# Patient Record
Sex: Male | Born: 1995
Health system: Southern US, Community
[De-identification: ages and names within clinical notes are randomized; demographics above are authoritative.]

## PROBLEM LIST (undated history)

## (undated) DIAGNOSIS — R41840 Attention and concentration deficit: Secondary | ICD-10-CM

## (undated) DIAGNOSIS — T7840XA Allergy, unspecified, initial encounter: Secondary | ICD-10-CM

## (undated) HISTORY — PX: NO PAST SURGERIES: SHX2092

## (undated) HISTORY — DX: Allergy, unspecified, initial encounter: T78.40XA

---
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
## 2015-01-13 ENCOUNTER — Telehealth: Payer: Self-pay | Admitting: Family Medicine

## 2015-01-13 NOTE — Telephone Encounter (Signed)
Approval rec'd  [id number]  Approved thru 01/13/16.  Faxed to pharmacy

## 2015-01-13 NOTE — Telephone Encounter (Addendum)
PA requested from pharmacy for D-Amphetamine 20 mg.  Submitted thru [facility] Case [id number].

## 2015-01-16 ENCOUNTER — Telehealth: Payer: Self-pay | Admitting: Family Medicine

## 2015-01-16 NOTE — Telephone Encounter (Signed)
Patients mom calling to say that the wrong adderall rx was given please call her at [phone] [place]

## 2015-01-17 MED ORDER — EPINEPHRINE 0.3 MG/0.3ML IJ SOAJ: 0.3000 mg | Freq: Once | INTRAMUSCULAR | Status: DC

## 2015-01-17 NOTE — Telephone Encounter (Signed)
LMTRC

## 2015-01-17 NOTE — Telephone Encounter (Signed)
Spoke to Mom and she states that he had been taking Adderall XR and wanted to know if we could change the prescription. Pt is going to try taking the Adderall later in the day to see if he can take it without the XR but if no help will call back for the XR. Per WTP ok to change and bring old RX's back if needing the XR.

## 2015-01-23 ENCOUNTER — Ambulatory Visit: Payer: Self-pay | Admitting: Family Medicine

## 2015-05-11 ENCOUNTER — Encounter: Payer: Self-pay | Admitting: Family Medicine

## 2015-05-11 ENCOUNTER — Ambulatory Visit (INDEPENDENT_AMBULATORY_CARE_PROVIDER_SITE_OTHER): Payer: 59 | Admitting: Family Medicine

## 2015-05-11 VITALS — BP 106/70 | HR 72 | Temp 97.8°F | Ht 69.0 in | Wt 148.5 lb

## 2015-05-11 DIAGNOSIS — Z23 Encounter for immunization: Secondary | ICD-10-CM | POA: Diagnosis not present

## 2015-05-11 DIAGNOSIS — F909 Attention-deficit hyperactivity disorder, unspecified type: Secondary | ICD-10-CM | POA: Diagnosis not present

## 2015-05-11 DIAGNOSIS — F988 Other specified behavioral and emotional disorders with onset usually occurring in childhood and adolescence: Secondary | ICD-10-CM

## 2015-05-11 NOTE — Progress Notes (Signed)
BP 106/70 mmHg  Pulse 72  Temp(Src) 97.8 F (36.6 C) (Oral)  Ht  (1.753 m)  Wt 148 lb 8 oz (67.359 kg)  BMI 21.92 kg/m2   CC: new pt to establish care  Subjective:    Patient ID: Michael Mccann, male    DOB: 11/16/1995, 19 y.o.   MRN: [id number]  HPI: Michael Mccann is a 19 y.o. male presenting on 05/11/2015 for Establish Care   Prior saw Dr Michael Mccann. H/o ADD on stimulant since middle school. 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 2 yr program. Father has his own company but he will likely work for someone else.   Endorses immunizations up to date - will review.  ADD - states no longer needs stimulants.   Unsure allergy - hives seen at [facility] for this. No other more severe anaphylactic reaction like dyspnea or throat swelling. Has been prescribed epi pen but never had to use.  No h/o seasonal allergic rhinitis.  Preventative: Unsure last CPE.  Flu shot today. Not sexually active. Has GF.   Lives with parents and 3 siblings, 2 dogs and 3 cats Edu: [employer] studying HVAC Activity: hunting, baseball, golf, 4 wheeler Diet: good water, gatorade, fruits/vegetables daily  Relevant past medical, surgical, family and social history reviewed and updated as indicated. Interim medical history since our last visit reviewed. Allergies and medications reviewed and updated. Current Outpatient Prescriptions on File Prior to Visit  Medication Sig  . EPINEPHrine (EPIPEN 2-PAK) 0.3 mg/0.3 mL IJ SOAJ injection Inject 0.3 mLs (0.3 mg total) into the muscle once. Pt unsure of allergic reaction (Patient not taking: Reported on 05/11/2015)   No current facility-administered medications on file prior to visit.    Review of Systems Per HPI unless specifically indicated in ROS section     Objective:    BP 106/70 mmHg  Pulse 72  Temp(Src) 97.8 F (36.6 C) (Oral)  Ht  (1.753 m)  Wt 148 lb 8 oz (67.359 kg)  BMI 21.92 kg/m2  Wt Readings from  Last 3 Encounters:  05/11/15 148 lb 8 oz (67.359 kg) (39 %*, Z = -0.27)  12/16/14 147 lb (66.679 kg) (39 %*, Z = -0.28)   * Growth percentiles are based on CDC 2-20 Years data.    Physical Exam  Constitutional: He is oriented to person, place, and time. He appears well-developed and well-nourished. No distress.  HENT:  Head: Normocephalic and atraumatic.  Right Ear: Hearing, tympanic membrane, external ear and ear canal normal.  Left Ear: Hearing, tympanic membrane, external ear and ear canal normal.  Nose: Nose normal.  Mouth/Throat: Uvula is midline, oropharynx is clear and moist and mucous membranes are normal. No oropharyngeal exudate, posterior oropharyngeal edema or posterior oropharyngeal erythema.  Eyes: Conjunctivae and EOM are normal. Pupils are equal, round, and reactive to light. No scleral icterus.  Neck: Normal range of motion. Neck supple. No thyromegaly present.  Cardiovascular: Normal rate, regular rhythm, normal heart sounds and intact distal pulses.   No murmur heard. Pulses:      Radial pulses are 2+ on the right side, and 2+ on the left side.  Pulmonary/Chest: Effort normal and breath sounds normal. No respiratory distress. He has no wheezes. He has no rales.  Abdominal: Soft. Bowel sounds are normal. He exhibits no distension and no mass. There is no tenderness. There is no rebound and no guarding.  Musculoskeletal: Normal range of motion. He exhibits no edema.  Lymphadenopathy:    He has no cervical adenopathy.  Neurological: He is alert and oriented to person, place, and time.  CN grossly intact, station and gait intact  Skin: Skin is warm and dry. No rash noted.  Psychiatric: He has a normal mood and affect. His behavior is normal. Judgment and thought content normal.  Nursing note and vitals reviewed.  No results found for this or any previous visit.    Assessment & Plan:  Over 20 minutes were spent face-to-face with the patient during this encounter and  >50% of that time was spent on counseling and coordination of care  Problem List Items Addressed This Visit    ADD (attention deficit disorder) - Primary (Chronic)    Doing well at community college off stimulant.       Other Visit Diagnoses    Need for influenza vaccination        Relevant Orders    Flu Vaccine QUAD 36+ mos PF IM (Fluarix & Fluzone Quad PF) (Completed)        Follow up plan: Return in about 1 year (around 05/10/2016) for annual exam, prior fasting for blood work.

## 2015-05-11 NOTE — Patient Instructions (Addendum)
You are doing well today. Good to see you. Return as needed or in 1 year for physical.  Health Maintenance, Male A healthy lifestyle and preventative care can promote health and wellness.  Maintain regular health, dental, and eye exams.  Eat a healthy diet. Foods like vegetables, fruits, whole grains, low-fat dairy products, and lean protein foods contain the nutrients you need and are low in calories. Decrease your intake of foods high in solid fats, added sugars, and salt. Get information about a proper diet from your health care provider, if necessary.  Regular physical exercise is one of the most important things you can do for your health. Most adults should get at least 150 minutes of moderate-intensity exercise (any activity that increases your heart rate and causes you to sweat) each week. In addition, most adults need muscle-strengthening exercises on 2 or more days a week.   Maintain a healthy weight. The body mass index (BMI) is a screening tool to identify possible weight problems. It provides an estimate of body fat based on height and weight. Your health care provider can find your BMI and can help you achieve or maintain a healthy weight. For males 20 years and older:  A BMI below 18.5 is considered underweight.  A BMI of 18.5 to 24.9 is normal.  A BMI of 25 to 29.9 is considered overweight.  A BMI of 30 and above is considered obese.  Maintain normal blood lipids and cholesterol by exercising and minimizing your intake of saturated fat. Eat a balanced diet with plenty of fruits and vegetables. Blood tests for lipids and cholesterol should begin at age 19 and be repeated every 5 years. If your lipid or cholesterol levels are high, you are over age 150, or you are at high risk for heart disease, you may need your cholesterol levels checked more frequently.Ongoing high lipid and cholesterol levels should be treated with medicines if diet and exercise are not working.  If you  smoke, find out from your health care provider how to quit. If you do not use tobacco, do not start.  Lung cancer screening is recommended for adults aged 55-80 years who are at high risk for developing lung cancer because of a history of smoking. A yearly low-dose CT scan of the lungs is recommended for people who have at least a 30-pack-year history of smoking and are current smokers or have quit within the past 15 years. A pack year of smoking is smoking an average of 1 pack of cigarettes a day for 1 year (for example, a 30-pack-year history of smoking could mean smoking 1 pack a day for 30 years or 2 packs a day for 15 years). Yearly screening should continue until the smoker has stopped smoking for at least 15 years. Yearly screening should be stopped for people who develop a health problem that would prevent them from having lung cancer treatment.  If you choose to drink alcohol, do not have more than 2 drinks per day. One drink is considered to be 12 oz (360 mL) of beer, 5 oz (150 mL) of wine, or 1.5 oz (45 mL) of liquor.  Avoid the use of street drugs. Do not share needles with anyone. Ask for help if you need support or instructions about stopping the use of drugs.  High blood pressure causes heart disease and increases the risk of stroke. High blood pressure is more likely to develop in:  People who have blood pressure in the end of the  normal range (100-139/85-89 mm Hg).  People who are overweight or obese.  People who are African American.  If you are 77-41 years of age, have your blood pressure checked every 3-5 years. If you are 34 years of age or older, have your blood pressure checked every year. You should have your blood pressure measured twice--once when you are at a hospital or clinic, and once when you are not at a hospital or clinic. Record the average of the two measurements. To check your blood pressure when you are not at a hospital or clinic, you can use:  An automated  blood pressure machine at a pharmacy.  A home blood pressure monitor.  If you are 34-58 years old, ask your health care provider if you should take aspirin to prevent heart disease.  Diabetes screening involves taking a blood sample to check your fasting blood sugar level. This should be done once every 3 years after age 36 if you are at a normal weight and without risk factors for diabetes. Testing should be considered at a younger age or be carried out more frequently if you are overweight and have at least 1 risk factor for diabetes.  Colorectal cancer can be detected and often prevented. Most routine colorectal cancer screening begins at the age of 48 and continues through age 26. However, your health care provider may recommend screening at an earlier age if you have risk factors for colon cancer. On a yearly basis, your health care provider may provide home test kits to check for hidden blood in the stool. A small camera at the end of a tube may be used to directly examine the colon (sigmoidoscopy or colonoscopy) to detect the earliest forms of colorectal cancer. Talk to your health care provider about this at age 32 when routine screening begins. A direct exam of the colon should be repeated every 5-10 years through age 31, unless early forms of precancerous polyps or small growths are found.  People who are at an increased risk for hepatitis B should be screened for this virus. You are considered at high risk for hepatitis B if:  You were born in a country where hepatitis B occurs often. Talk with your health care provider about which countries are considered high risk.  Your parents were born in a high-risk country and you have not received a shot to protect against hepatitis B (hepatitis B vaccine).  You have HIV or AIDS.  You use needles to inject street drugs.  You live with, or have sex with, someone who has hepatitis B.  You are a man who has sex with other men (MSM).  You get  hemodialysis treatment.  You take certain medicines for conditions like cancer, organ transplantation, and autoimmune conditions.  Hepatitis C blood testing is recommended for all people born from [birth year] through 1965 and any individual with known risk factors for hepatitis C.  Healthy men should no longer receive prostate-specific antigen (PSA) blood tests as part of routine cancer screening. Talk to your health care provider about prostate cancer screening.  Testicular cancer screening is not recommended for adolescents or adult males who have no symptoms. Screening includes self-exam, a health care provider exam, and other screening tests. Consult with your health care provider about any symptoms you have or any concerns you have about testicular cancer.  Practice safe sex. Use condoms and avoid high-risk sexual practices to reduce the spread of sexually transmitted infections (STIs).  You should be screened for  STIs, including gonorrhea and chlamydia if:  You are sexually active and are younger than 24 years.  You are older than 24 years, and your health care provider tells you that you are at risk for this type of infection.  Your sexual activity has changed since you were last screened, and you are at an increased risk for chlamydia or gonorrhea. Ask your health care provider if you are at risk.  If you are at risk of being infected with HIV, it is recommended that you take a prescription medicine daily to prevent HIV infection. This is called pre-exposure prophylaxis (PrEP). You are considered at risk if:  You are a man who has sex with other men (MSM).  You are a heterosexual man who is sexually active with multiple partners.  You take drugs by injection.  You are sexually active with a partner who has HIV.  Talk with your health care provider about whether you are at high risk of being infected with HIV. If you choose to begin PrEP, you should first be tested for HIV. You should  then be tested every 3 months for as long as you are taking PrEP.  Use sunscreen. Apply sunscreen liberally and repeatedly throughout the day. You should seek shade when your shadow is shorter than you. Protect yourself by wearing long sleeves, pants, a wide-brimmed hat, and sunglasses year round whenever you are outdoors.  Tell your health care provider of new moles or changes in moles, especially if there is a change in shape or color. Also, tell your health care provider if a mole is larger than the size of a pencil eraser.  A one-time screening for abdominal aortic aneurysm (AAA) and surgical repair of large AAAs by ultrasound is recommended for men aged 45-75 years who are current or former smokers.  Stay current with your vaccines (immunizations).   This information is not intended to replace advice given to you by your health care provider. Make sure you discuss any questions you have with your health care provider.   Document Released: 11/09/2007 Document Revised: 06/03/2014 Document Reviewed: 10/08/2010 Elsevier Interactive Patient Education [employer].

## 2015-05-11 NOTE — Assessment & Plan Note (Signed)
Doing well at [employer] off stimulant.

## 2015-05-11 NOTE — Progress Notes (Signed)
Pre visit review using our clinic review tool, if applicable. No additional management support is needed unless otherwise documented below in the visit note. 

## 2016-05-13 ENCOUNTER — Encounter: Payer: 59 | Admitting: Family Medicine

## 2016-05-13 DIAGNOSIS — Z0289 Encounter for other administrative examinations: Secondary | ICD-10-CM

## 2016-05-14 ENCOUNTER — Telehealth: Payer: Self-pay | Admitting: Family Medicine

## 2016-05-14 NOTE — Telephone Encounter (Signed)
No f/u at this time. 

## 2016-05-14 NOTE — Telephone Encounter (Signed)
Patient did not come in for their appointment 05/13/16 for cpe  Please let me know if patient needs to be contacted immediately for follow up or no follow up needed.

## 2017-06-09 ENCOUNTER — Ambulatory Visit: Payer: BLUE CROSS/BLUE SHIELD | Admitting: Family Medicine

## 2017-06-09 ENCOUNTER — Encounter: Payer: Self-pay | Admitting: Family Medicine

## 2017-06-09 ENCOUNTER — Ambulatory Visit (INDEPENDENT_AMBULATORY_CARE_PROVIDER_SITE_OTHER)
Admission: RE | Admit: 2017-06-09 | Discharge: 2017-06-09 | Disposition: A | Discharge status: Home / Self Care | Payer: Self-pay | Source: Ambulatory Visit | Attending: Family Medicine | Admitting: Family Medicine

## 2017-06-09 VITALS — BP 118/74 | HR 64 | Temp 98.0°F | Wt 166.0 lb

## 2017-06-09 DIAGNOSIS — M79632 Pain in left forearm: Secondary | ICD-10-CM | POA: Diagnosis not present

## 2017-06-09 DIAGNOSIS — Z23 Encounter for immunization: Secondary | ICD-10-CM | POA: Diagnosis not present

## 2017-06-09 MED ORDER — DICLOFENAC SODIUM 1 % TD GEL: 1.0000 "application " | Freq: Three times a day (TID) | TRANSDERMAL | 1 refills | Status: DC

## 2017-06-09 NOTE — Patient Instructions (Addendum)
Flu shot today Left forearm xray today. I do think you have strained your forearm - treat with continued ibuprofen, may try voltaren topical gel to sore area, and avoid repetitive hand motions or heavy lifting for 10 days. Let me know if not improving with this.

## 2017-06-09 NOTE — Progress Notes (Signed)
   BP 118/74 (BP Location: Left Arm, Patient Position: Sitting, Cuff Size: Normal)   Pulse 64   Temp 98 F (36.7 C) (Oral)   Wt 166 lb (75.3 kg)   SpO2 99%   BMI 24.51 kg/m    CC: L arm pain Subjective:    Patient ID: Michael Mccann, male    DOB: September 21, 1995, 22 y.o.   MRN: [id number]  HPI: Michael HintChristopher R Bruno is a 22 y.o. male presenting on 06/09/2017 for Arm Pain (Located in left forearm.  Pain is achy. Denies injury. Pain occurs with use. Started about 2 mos ago. Has taken ibuprofen)   Last seen 04/2015.  2 mo h/o L lateral forearm pain. More pain at work - Therapist, sportslifting cinder blocks and brick.  Denies inciting trauma/injury or falls.  Taking ibuprofen with some improvement.  No prior joint or muscle or bone pains.  No weakness of arm. Some tingling of forearm muscles.   Relevant past medical, surgical, family and social history reviewed and updated as indicated. Interim medical history since our last visit reviewed. Allergies and medications reviewed and updated. Outpatient Medications Prior to Visit  Medication Sig Dispense Refill  . EPINEPHrine (EPIPEN 2-PAK) 0.3 mg/0.3 mL IJ SOAJ injection Inject 0.3 mLs (0.3 mg total) into the muscle once. Pt unsure of allergic reaction (Patient not taking: Reported on 05/11/2015) 1 Device 1   No facility-administered medications prior to visit.      Per HPI unless specifically indicated in ROS section below Review of Systems     Objective:    BP 118/74 (BP Location: Left Arm, Patient Position: Sitting, Cuff Size: Normal)   Pulse 64   Temp 98 F (36.7 C) (Oral)   Wt 166 lb (75.3 kg)   SpO2 99%   BMI 24.51 kg/m   Wt Readings from Last 3 Encounters:  06/09/17 166 lb (75.3 kg)  05/11/15 148 lb 8 oz (67.4 kg) (39 %, Z= -0.27)*  12/16/14 147 lb (66.7 kg) (39 %, Z= -0.28)*   * Growth percentiles are based on CDC (Boys, 2-20 Years) data.    Physical Exam  Constitutional: He is oriented to person, place, and time. He appears  well-developed and well-nourished. No distress.  Musculoskeletal: Normal range of motion. He exhibits no edema.  2+ rad pulses bilaterally R arm WNL L forearm mild discomfort to palpation of medial and lateral epicondyles  Soreness to palpation along dorsal and lateral forearm Pain with resistance testing of wrist extenders   Neurological: He is alert and oriented to person, place, and time.  5/5 strength Sensation intact  Skin: Skin is warm and dry. No rash noted.  Nursing note and vitals reviewed.  No results found for this or any previous visit.    Assessment & Plan:   Problem List Items Addressed This Visit    Left forearm pain - Primary    No bone tenderness Anticipate L forearm strain.  Check xray of arm.  Voltaren gel, continue ibuporfen, limit heavy exertion at work x 10 days.  Update if not improving with treatment.  Not consistent with epicondylitis. R/o nerve entrapment if no better.       Relevant Orders   DG Forearm Left       Follow up plan: Return if symptoms worsen or fail to improve.  Eustaquio BoydenJavier Matisse Roskelley, MD

## 2017-06-09 NOTE — Addendum Note (Signed)
Addended by: Nanci PinaGOINS, Aloura Matsuoka on: 06/09/2017 03:22 PM   Modules accepted: Orders

## 2017-06-09 NOTE — Assessment & Plan Note (Signed)
No bone tenderness Anticipate L forearm strain.  Check xray of arm.  Voltaren gel, continue ibuporfen, limit heavy exertion at work x 10 days.  Update if not improving with treatment.  Not consistent with epicondylitis. R/o nerve entrapment if no better.

## 2017-07-31 ENCOUNTER — Telehealth: Payer: Self-pay

## 2017-07-31 NOTE — Telephone Encounter (Signed)
This was for a rx done in January, Went ahead and started PA on CMM just in case.

## 2017-09-19 NOTE — Telephone Encounter (Signed)
Received PA denial stating this medication is approved to treat OA of the elbow, wrist, hand, knee, ankle or foot when not used with an oral anti-inflammatory medication.

## 2017-10-03 ENCOUNTER — Other Ambulatory Visit: Payer: Self-pay

## 2017-10-03 ENCOUNTER — Encounter (HOSPITAL_COMMUNITY): Payer: Self-pay | Admitting: Emergency Medicine

## 2017-10-03 ENCOUNTER — Emergency Department (HOSPITAL_COMMUNITY)
Admission: EM | Admit: 2017-10-03 | Discharge: 2017-10-03 | Disposition: A | Discharge status: Home / Self Care | Payer: BLUE CROSS/BLUE SHIELD | Attending: Emergency Medicine | Admitting: Emergency Medicine

## 2017-10-03 ENCOUNTER — Emergency Department (HOSPITAL_COMMUNITY): Payer: BLUE CROSS/BLUE SHIELD

## 2017-10-03 DIAGNOSIS — R0789 Other chest pain: Secondary | ICD-10-CM | POA: Diagnosis present

## 2017-10-03 DIAGNOSIS — F909 Attention-deficit hyperactivity disorder, unspecified type: Secondary | ICD-10-CM | POA: Insufficient documentation

## 2017-10-03 DIAGNOSIS — F10920 Alcohol use, unspecified with intoxication, uncomplicated: Secondary | ICD-10-CM | POA: Diagnosis not present

## 2017-10-03 DIAGNOSIS — F17210 Nicotine dependence, cigarettes, uncomplicated: Secondary | ICD-10-CM | POA: Insufficient documentation

## 2017-10-03 DIAGNOSIS — F1910 Other psychoactive substance abuse, uncomplicated: Secondary | ICD-10-CM | POA: Diagnosis not present

## 2017-10-03 DIAGNOSIS — R51 Headache: Secondary | ICD-10-CM | POA: Insufficient documentation

## 2017-10-03 DIAGNOSIS — Z79899 Other long term (current) drug therapy: Secondary | ICD-10-CM | POA: Diagnosis not present

## 2017-10-03 DIAGNOSIS — Y902 Blood alcohol level of 40-59 mg/100 ml: Secondary | ICD-10-CM | POA: Insufficient documentation

## 2017-10-03 DIAGNOSIS — R079 Chest pain, unspecified: Secondary | ICD-10-CM

## 2017-10-03 LAB — CBC WITH DIFFERENTIAL/PLATELET
BASOS PCT: 0 %
Basophils Absolute: 0 10*3/uL (ref 0.0–0.1)
EOS ABS: 0 10*3/uL (ref 0.0–0.7)
Eosinophils Relative: 1 %
HEMATOCRIT: 44 % (ref 39.0–52.0)
HEMOGLOBIN: 15.7 g/dL (ref 13.0–17.0)
Lymphocytes Relative: 25 %
Lymphs Abs: 1.6 10*3/uL (ref 0.7–4.0)
MCH: 33 pg (ref 26.0–34.0)
MCHC: 35.7 g/dL (ref 30.0–36.0)
MCV: 92.4 fL (ref 78.0–100.0)
MONO ABS: 0.4 10*3/uL (ref 0.1–1.0)
MONOS PCT: 6 %
NEUTROS PCT: 68 %
Neutro Abs: 4.4 10*3/uL (ref 1.7–7.7)
Platelets: 183 10*3/uL (ref 150–400)
RBC: 4.76 MIL/uL (ref 4.22–5.81)
RDW: 12 % (ref 11.5–15.5)
WBC: 6.5 10*3/uL (ref 4.0–10.5)

## 2017-10-03 LAB — I-STAT TROPONIN, ED: Troponin i, poc: 0 ng/mL (ref 0.00–0.08)

## 2017-10-03 LAB — COMPREHENSIVE METABOLIC PANEL
ALBUMIN: 4.5 g/dL (ref 3.5–5.0)
ALK PHOS: 100 U/L (ref 38–126)
ALT: 18 U/L (ref 17–63)
ANION GAP: 12 (ref 5–15)
AST: 25 U/L (ref 15–41)
BILIRUBIN TOTAL: 2.3 mg/dL — AB (ref 0.3–1.2)
BUN: 15 mg/dL (ref 6–20)
CALCIUM: 9.3 mg/dL (ref 8.9–10.3)
CO2: 23 mmol/L (ref 22–32)
Chloride: 106 mmol/L (ref 101–111)
Creatinine, Ser: 1.02 mg/dL (ref 0.61–1.24)
GFR calc Af Amer: >60 mL/min (ref >60)
GFR calc non Af Amer: >60 mL/min (ref >60)
Glucose, Bld: 96 mg/dL (ref 65–99)
POTASSIUM: 3.5 mmol/L (ref 3.5–5.1)
SODIUM: 141 mmol/L (ref 135–145)
TOTAL PROTEIN: 7.7 g/dL (ref 6.5–8.1)

## 2017-10-03 LAB — RAPID URINE DRUG SCREEN, HOSP PERFORMED
Amphetamines: POSITIVE — AB
BARBITURATES: NOT DETECTED
Benzodiazepines: NOT DETECTED
Cocaine: NOT DETECTED
Opiates: NOT DETECTED
TETRAHYDROCANNABINOL: NOT DETECTED

## 2017-10-03 LAB — ACETAMINOPHEN LEVEL

## 2017-10-03 LAB — ETHANOL: Alcohol, Ethyl (B): 52 mg/dL — ABNORMAL HIGH (ref <10)

## 2017-10-03 LAB — SALICYLATE LEVEL: Salicylate Lvl: <7 mg/dL (ref 2.8–30.0)

## 2017-10-03 MED ORDER — KETOROLAC TROMETHAMINE 30 MG/ML IJ SOLN
30.0000 mg | Freq: Once | INTRAMUSCULAR | Status: AC
  Administered 2017-10-03: 30 mg via INTRAVENOUS
  Filled 2017-10-03: qty 1

## 2017-10-03 MED ORDER — SODIUM CHLORIDE 0.9 % IV BOLUS
1000.0000 mL | Freq: Once | INTRAVENOUS | Status: AC
  Administered 2017-10-03: 1000 mL via INTRAVENOUS
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
## 2017-10-03 NOTE — Discharge Instructions (Addendum)
Your labs today were reassuring as was your EKG and chest x-ray.  Your drug screen was positive for amphetamines.  Please stop using illicit drugs.
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
## 2017-10-03 NOTE — ED Notes (Signed)
Returned from xray

## 2018-02-25 ENCOUNTER — Encounter: Payer: Self-pay | Admitting: Family Medicine

## 2018-02-25 ENCOUNTER — Ambulatory Visit (INDEPENDENT_AMBULATORY_CARE_PROVIDER_SITE_OTHER): Payer: BLUE CROSS/BLUE SHIELD | Admitting: Family Medicine

## 2018-02-25 VITALS — BP 118/80 | HR 79 | Temp 98.5°F | Ht 69.5 in | Wt 160.0 lb

## 2018-02-25 DIAGNOSIS — M25561 Pain in right knee: Secondary | ICD-10-CM | POA: Diagnosis not present

## 2018-02-25 MED ORDER — EPINEPHRINE 0.3 MG/0.3ML IJ SOAJ: 0.3000 mg | Freq: Once | INTRAMUSCULAR | 1 refills | Status: AC

## 2018-02-25 NOTE — Patient Instructions (Signed)
I think you have knee ligament sprain and inflammation. Treat with exercises provided today, take aleve twice a day with meals for 1 week, ice to knee (wrapped in towel) and elevate leg. Consider knee sleeve brace for long standing/walking.  Let us know if not improving for physical therapy.

## 2018-02-25 NOTE — Assessment & Plan Note (Addendum)
Has had prior injury to R knee - anticipate knee strain with possible MCL sprain and PFPS. Treat accordingly with conservative measures of elevation, rest, aleve for 1 wk scheduled, ice, and knee brace use. Strengthening/stretching exercises provided today. If no better, will refer to PT. Pt agrees with plan.
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
## 2018-08-09 ENCOUNTER — Encounter: Payer: Self-pay | Admitting: Medical Oncology

## 2018-08-09 ENCOUNTER — Emergency Department
Admission: EM | Admit: 2018-08-09 | Discharge: 2018-08-09 | Disposition: A | Discharge status: Home / Self Care | Payer: BLUE CROSS/BLUE SHIELD | Attending: Student in an Organized Health Care Education/Training Program | Admitting: Student in an Organized Health Care Education/Training Program

## 2018-08-09 ENCOUNTER — Other Ambulatory Visit: Payer: Self-pay

## 2018-08-09 DIAGNOSIS — Z87891 Personal history of nicotine dependence: Secondary | ICD-10-CM | POA: Insufficient documentation

## 2018-08-09 DIAGNOSIS — T782XXA Anaphylactic shock, unspecified, initial encounter: Secondary | ICD-10-CM | POA: Insufficient documentation

## 2018-08-09 DIAGNOSIS — L509 Urticaria, unspecified: Secondary | ICD-10-CM | POA: Diagnosis present

## 2018-08-09 MED ORDER — FAMOTIDINE IN NACL 20-0.9 MG/50ML-% IV SOLN
20.0000 mg | Freq: Once | INTRAVENOUS | Status: AC
  Administered 2018-08-09: 20 mg via INTRAVENOUS
  Filled 2018-08-09: qty 50

## 2018-08-09 MED ORDER — PREDNISONE 20 MG PO TABS
60.0000 mg | ORAL_TABLET | Freq: Once | ORAL | Status: AC
  Administered 2018-08-09: 60 mg via ORAL
  Filled 2018-08-09: qty 3

## 2018-08-09 MED ORDER — EPINEPHRINE 0.3 MG/0.3ML IJ SOAJ
0.3000 mg | Freq: Once | INTRAMUSCULAR | Status: AC
  Administered 2018-08-09: 0.3 mg via INTRAMUSCULAR
  Filled 2018-08-09: qty 0.3

## 2018-08-09 MED ORDER — DIPHENHYDRAMINE HCL 50 MG/ML IJ SOLN
12.5000 mg | Freq: Once | INTRAMUSCULAR | Status: AC
  Administered 2018-08-09: 12.5 mg via INTRAVENOUS
  Filled 2018-08-09: qty 1

## 2018-08-09 MED ORDER — EPINEPHRINE 0.3 MG/0.3ML IJ SOAJ: 0.3000 mg | INTRAMUSCULAR | 1 refills | Status: DC | PRN

## 2018-08-09 MED ORDER — PREDNISONE 50 MG PO TABS: 50.0000 mg | ORAL_TABLET | Freq: Every day | ORAL | 0 refills | Status: DC

## 2018-08-09 NOTE — ED Provider Notes (Signed)
[facility] Emergency Department Provider Note    First MD Initiated Contact with Patient 08/09/18 1804     (approximate)  I have reviewed the triage vital signs and the nursing notes.   HISTORY  Chief Complaint Allergic Reaction    HPI Michael Mccann is a 23 y.o. male with a history of allergic reaction presents the ER for evaluation of hives facial swelling and tightness in his throat that occurred shortly prior to arrival after he was eating food.  Uncertain as to what his allergy is.  Has had to have epinephrine shots in the past.  Denies any fevers.  No nausea or vomiting.    Past Medical History:  Diagnosis Date  . Allergy    hives  . Attention deficit    Family History  Problem Relation Age of Onset  . CAD Paternal Grandfather 54       MI s/p stents x2  . Hyperlipidemia Paternal Grandfather   . Hypertension Paternal Grandfather   . Arthritis Maternal Grandfather   . Cancer Other        breast - great aunt and cousin  . Diabetes Neg Hx    Past Surgical History:  Procedure Laterality Date  . NO PAST SURGERIES     Patient Active Problem List   Diagnosis Date Noted  . Right medial knee pain 02/25/2018  . Left forearm pain 06/09/2017  . ADD (attention deficit disorder) 12/16/2014      Prior to Admission medications   Not on File    Allergies Patient has no known allergies.    Social History Social History   Tobacco Use  . Smoking status: Former Smoker    Types: E-cigarettes    Last attempt to quit: 11/25/2017    Years since quitting: 0.7  . Smokeless tobacco: Never Used  Substance Use Topics  . Alcohol use: Yes    Alcohol/week: 0.0 standard drinks    Comment: occ  . Drug use: Yes    Types: Cocaine    Comment: not in a while    Review of Systems Patient denies headaches, rhinorrhea, blurry vision, numbness, shortness of breath, chest pain, edema, cough, abdominal pain, nausea, vomiting, diarrhea, dysuria,  fevers, rashes or hallucinations unless otherwise stated above in HPI. ____________________________________________   PHYSICAL EXAM:  VITAL SIGNS: Vitals:   08/09/18 1801 08/09/18 1900  BP: 135/83   Pulse: (!) 102 82  Resp: 18   Temp: 98.1 F (36.7 C)   SpO2: 100% 97%    Constitutional: Alert and oriented.  Eyes: Conjunctivae are normal. Periorbital urticaria nad edema Head: Atraumatic. Nose: No congestion/rhinnorhea. Mouth/Throat: Mucous membranes are moist.  + uvular edema Neck: No stridor. Painless ROM. Diffuse urticaria of neck Cardiovascular: Normal rate, regular rhythm. Grossly normal heart sounds.  Good peripheral circulation. Respiratory: Normal respiratory effort.  No retractions. Lungs CTAB. Gastrointestinal: Soft and nontender. No distention. No abdominal bruits. No CVA tenderness. Genitourinary:  Musculoskeletal: No lower extremity tenderness nor edema.  No joint effusions. Neurologic:  Normal speech and language. No gross focal neurologic deficits are appreciated. No facial droop Skin:  Skin is warm, dry and intact. Scattered urticaria in patchs of trunk Psychiatric: Mood and affect are normal. Speech and behavior are normal.  ____________________________________________   LABS (all labs ordered are listed, but only abnormal results are displayed)  No results found for this or any previous visit (from the past 24 hour(s)). ____________________________________________ ____________________________________________  RADIOLOGY   ____________________________________________   PROCEDURES  Procedure(s) performed:  Procedures    Critical Care performed: no ____________________________________________   INITIAL IMPRESSION / ASSESSMENT AND PLAN / ED COURSE  Pertinent labs & imaging results that were available during my care of the patient were reviewed by me and considered in my medical decision making (see chart for details).   DDX: anaphylaxis,  urticaria, anaphylactoid  LORETO VINCE is a 23 y.o. who presents to the ED with its of anaphylactic reaction.  Patient will be given epinephrine as well as H1 and H2 blockade and steroids.  Will observe in the ER.  Clinical Course as of Aug 08 2004  Sun Aug 09, 2018  [id number] Patient reassessed.  Had significant improvement in his symptoms.  We will continue to observe.   [PR]    Clinical Course User Index [PR] Willy Eddy, MD   3-hour observation will be over at 9:00.  Anticipate patient will be discharged home assuming no evidence of recurrent anaphylaxis or urticaria.  Will be discharged with prescription for EpiPen as well as steroids.   As part of my medical decision making, I reviewed the following data within the electronic MEDICAL RECORD NUMBER Nursing notes reviewed and incorporated, Labs reviewed, notes from prior ED visits and [place] Controlled Substance Database   ____________________________________________   FINAL CLINICAL IMPRESSION(S) / ED DIAGNOSES  Final diagnoses:  Anaphylaxis, initial encounter      NEW MEDICATIONS STARTED DURING THIS VISIT:  New Prescriptions   No medications on file     Note:  This document was prepared using Dragon voice recognition software and may include unintentional dictation errors.Willy Eddy, MD 08/09/18 2006

## 2018-08-09 NOTE — ED Triage Notes (Signed)
Pt reports reports he ate some nuts about ago and began having hives, eye swelling, and feeling like his throat is tight. Pt a/o x 4. Denies any known allergies.

## 2018-08-09 NOTE — ED Notes (Signed)
Pt feels much better. Hives and swelling decreased

## 2018-08-27 ENCOUNTER — Other Ambulatory Visit: Payer: Self-pay

## 2018-08-27 ENCOUNTER — Ambulatory Visit: Payer: BLUE CROSS/BLUE SHIELD | Admitting: Allergy & Immunology

## 2018-11-02 ENCOUNTER — Telehealth: Payer: Self-pay | Admitting: *Deleted

## 2018-11-02 ENCOUNTER — Ambulatory Visit (INDEPENDENT_AMBULATORY_CARE_PROVIDER_SITE_OTHER)
Admission: RE | Admit: 2018-11-02 | Discharge: 2018-11-02 | Disposition: A | Discharge status: Home / Self Care | Payer: BLUE CROSS/BLUE SHIELD | Source: Ambulatory Visit

## 2018-11-02 DIAGNOSIS — R05 Cough: Secondary | ICD-10-CM

## 2018-11-02 DIAGNOSIS — J029 Acute pharyngitis, unspecified: Secondary | ICD-10-CM

## 2018-11-02 DIAGNOSIS — R51 Headache: Secondary | ICD-10-CM

## 2018-11-02 DIAGNOSIS — Z20822 Contact with and (suspected) exposure to covid-19: Secondary | ICD-10-CM

## 2018-11-02 DIAGNOSIS — Z20828 Contact with and (suspected) exposure to other viral communicable diseases: Secondary | ICD-10-CM

## 2018-11-02 NOTE — ED Provider Notes (Signed)
Virtual Visit via Video Note:  Michael Mccann  initiated request for Telemedicine visit with [facility] Urgent Care team. I connected with Michael Mccann  on 11/02/2018 at 4:08 PM  for a synchronized telemedicine visit using a video enabled HIPPA compliant telemedicine application. I verified that I am speaking with Michael Mccann  using two identifiers. Zigmund Gottron, NP  was physically located in a [facility] Urgent care site and Michael Mccann was located at a different location.   The limitations of evaluation and management by telemedicine as well as the availability of in-person appointments were discussed. Patient was informed that he  may incur a bill ( including co-pay) for this virtual visit encounter. Michael Mccann  expressed understanding and gave verbal consent to proceed with virtual visit.     History of Present Illness:Michael Mccann  is a 23 y.o. male presents with complaints of possible Covid exposure. Girl friend. Positive test this am. Cough and sore throat for the past 3 days. Headache. No fever. No body aches. No gi complaints. No loss of taste or smell. Without contributing medical history.  Doesn't smoke. He states he would like to go back to work as soon as possible, therefore he is also considering going to get testing at [facility].   Past Medical History:  Diagnosis Date  . Allergy    hives  . Attention deficit     No Known Allergies      Observations/Objective: Alert, oriented, non toxic in appearance. Clear coherent speech without difficulty. No increased work of breathing visualized.    Assessment and Plan:  Message sent for order for Covid testing for return to work decision making. Return precautions provided and self isolation recommended due to exposure. Patient verbalized understanding and agreeable to plan.   Follow Up Instructions:    I discussed the assessment and treatment plan with the patient. The patient was provided  an opportunity to ask questions and all were answered. The patient agreed with the plan and demonstrated an understanding of the instructions.   The patient was advised to call back or seek an in-person evaluation if the symptoms worsen or if the condition fails to improve as anticipated.  I provided 11 minutes of non-face-to-face time during this encounter.    Zigmund Gottron, NP  11/02/2018 4:08 PM         Zigmund Gottron, NP 11/02/18 2337

## 2018-11-02 NOTE — Telephone Encounter (Signed)
Called pt to schedule the covid-19 test. Pt stated that he was at [facility] now, getting ready to take the test.

## 2018-11-02 NOTE — Telephone Encounter (Signed)
-----   Message from Zigmund Gottron, NP sent at 11/02/2018  4:16 PM EDT ----- Regarding: covid testing needed

## 2018-11-02 NOTE — Discharge Instructions (Signed)
You will be called from our team either tonight or tomorrow for scheduling of testing at our [facility].  It looks like results have been taking 2-3 days.  Please self isolate until results are back due to your cough and close contact to positive.

## 2019-01-11 ENCOUNTER — Encounter: Payer: Self-pay | Admitting: Family Medicine

## 2019-01-11 ENCOUNTER — Ambulatory Visit (INDEPENDENT_AMBULATORY_CARE_PROVIDER_SITE_OTHER): Payer: No Typology Code available for payment source | Admitting: Family Medicine

## 2019-01-11 ENCOUNTER — Other Ambulatory Visit: Payer: Self-pay

## 2019-01-11 VITALS — BP 110/88 | HR 82 | Temp 98.7°F | Ht 69.5 in | Wt 151.0 lb

## 2019-01-11 DIAGNOSIS — S060X9A Concussion with loss of consciousness of unspecified duration, initial encounter: Secondary | ICD-10-CM | POA: Diagnosis not present

## 2019-01-11 DIAGNOSIS — S83411A Sprain of medial collateral ligament of right knee, initial encounter: Secondary | ICD-10-CM | POA: Diagnosis not present

## 2019-01-11 NOTE — Patient Instructions (Signed)
Mild Traumatic Brain Injury (CONCUSSSION):  Michael Quijas, MD, [facility] Adapted from [facility], [facility] [facility], and [employer]  Think of the human brain almost as an egg yolk and your skull as an egg shell. When your head or body takes a hit, it can cause your brain to shake around inside your skull, injuring the brain. A concussion is not only caused by a hit to the head, but can also be caused by an impact to the body that ends up shaking your brain in your skull, such as whiplash.  A common misconception about concussion is that one loses consciousness. However, loss of consciousness occurs in only less than 10% of concussion cases.  Concussive symptoms typically resolve in 7 to 10 days (sports-related concussions) or within 3 months (non-athletes).  Approximately 20% of people have symptoms after 6 weeks.  With concussions, we have learned that it generally takes youth longer to recover from concussions. Another thing that we now know about concussions is that it usually takes male athletes longer to recover than male athletes.  No two concussions are identical. In fact, there are at least six different clinical trajectories that concussions may take.  Signs and Symptoms of Concussion:  The signs and symptoms of a concussion are incredibly important because a concussion doesn't show up on imaging like an x-ray, CT, or MRI scan and there is no objective test, like a blood or saliva test, that can determine if a patient has a concussion. A doctor makes a concussion diagnosis based on the results of a comprehensive examination, which includes observing signs of concussion and patients reporting symptoms of concussion appearing after an impact to the head or body. Concussion signs and symptoms are the brain's way of showing it is injured and not functioning normally.  CONCUSSION SIGNS  Concussion signs are what someone could observe about you to  determine if you have a concussion.   Common concussion signs include:  Loss of consciousness Problems with balance Glazed look in the eyes Amnesia Delayed response to questions Forgetting an instruction, confusion about an assignment or position, or  confusion of the game, score, or opponent Inappropriate crying Inappropriate laughter Vomiting  CONCUSSION SYMPTOMS  Concussion symptoms are what someone who is concussed will tell you that they are experiencing. Concussion symptoms typically fall into six major categories:  1- Somatic (Physical) Symptoms  Headache Light-headedness Dizziness Nausea Sensitivity to light Sensitivity to noise  2- Cognitive Symptoms  Difficulties with attention Memory problems Loss of focus Difficulty multitasking Difficulty completing mental tasks  3- Sleep Symptoms  Sleeping more than usual Sleeping less than usual Having trouble falling asleep  4- Emotional Symptoms  Anxiety Depression Panic attacks  5 - Vestibular Symptoms  The vestibular system is affected in nearly 60% of youth and adolescent athletes following a concussion.  But what is the vestibular system?  It's the sensory system that helps with your sense of balance and spatial orientation. Think of a gyroscope! With help from your inner ears, your vestibular system detects the motion or position of your head in space. It sends information to your brain that's needed for balance and stable vision.  Need an example? If you're moving and looking at moving objects at the same time (think riding in a car), you're able to stay focused and not lose visual clarity. During a vestibular concussion, your gyroscope isn't working at full potential.  Difficulty with balance Dizziness. It may feel like the room is spinning   or a slow, wavy sensation. (Like you're on a boat!) Trouble stabilizing vision when moving your head. (We call this Vestibular-Ocular Reflux, or VOR.) Think  back to riding in a car. With a vestibular concussion, you can't stay focused. (The technical name for this is Visual Motion Sensitivity, or VMS.) Triggers  These 3 things could bring on vestibular symptoms:  Dynamic movements Busy environments, like the grocery store Crowds  6 - Oculo-motor  A concussion that affects the ocular, or visual, system of the brain. Typically, patients with ocular-motor concussions report pressure headaches in the front of their head, feeling more tired than normal, and becoming more symptomatic doing math or science exercises at school.  Patients also experience difficulties with their eyes working together. Follow along to explore some of the most common symptoms.  Convergence  The eyes converge when viewing objects up close, such as with reading. With convergence problems, patients may see a double image as a target moves closer to them. Typically, without a concussion, objects can be brought very close without doubling.  Accommodation  Accommodation problems cause an object to become blurry as it is viewed up close. Accommodative and convergence problems are often experienced together and can impact reading and other near-vision activities.  Pursuits and Saccades  The eyes use pursuit eye movements to follow objects; while saccade eyes movements allow the eyes to shift rapidly from one object to another. Tracking objects, reading a book, scrolling on a computer or even watching for a moving car while crossing the street can be difficult when patients have problems with pursuit and saccade eye movements.  Misalignment  When people with eye misalignment (one eye drifts, eyes aren't perfectly aligned) sustain a concussion, the brain may have difficulty compensating for the misalignment like it once did. This can result in blurry vision, difficulty taking notes in class and focusing on the chalkboard.  Note: This is not an exhaustive list of concussion  signs and symptoms, and it may take a few days for concussion symptoms to appear after the initial injury.  TREATMENT:  THE MOST IMPORTANT THING IS REST AS SOON AS POSSIBLE AFTER INJURY SO THAT THE BRAIN CAN RECOVER. COMPLETE PHYSICAL AND MENTAL REST ARE NEEDED INITIALLY.   THAT MEANS: NO SCHOOL OR WORK FOR AT LEAST 3 DAYS AND CLEARED BY YOUR DOCTOR NO MENTAL EXERTION, MEANING NO WORK, NO HOMEWORK, NO TEST TAKING.  Avoid situations with loud noise, bright lights, or crowds. However, this doesn't mean isolate yourself in a dark room for a week. Too much isolation and boredom can be harmful, contributing to feelings of anxiety, depression, and resulting in increased recovery time. Spend time with friends and family, but monitor your symptoms and avoid situations that make you feel worse.   NO VIDEO GAMES, NO USING THE COMPUTER, NO TEXTING, NO USING SMARTPHONES, NO USE OF AN IPAD OR TABLET. DO NOT GO TO A MOVIE THEATRE OR WATCH SPORTS ON TV. HDTV TENDS TO MAKE PEOPLE FEEL WORSE.   ON APPROXIMATELY DAY 4, BEGIN VERY LIGHT EXERCISE SUCH AS WALKING. DO NOT START ANY STRENUOUS EXERCISE.   You will be given return to school or return to work recommendations.  

## 2019-01-11 NOTE — Progress Notes (Signed)
Casha Estupinan T. Temiloluwa Laredo, MD Primary Care and Sports Medicine [facility] at [facility] [place], [address] Phone: [phone]  FAX: [place] - 23 y.o. male  MRN [id number]  Date of Birth: 04-23-1996  Visit Date: 01/11/2019  PCP: Ria Bush, MD  Referred by: No ref. provider found  Chief Complaint  Patient presents with  . Hit Head    yesterday and blacked out-hit floor  . Knee Injury    Right-twisted when fell yesterday   Subjective:   Michael Mccann is a 23 y.o. very pleasant male patient with Body mass index is 21.98 kg/m. who presents with the following:  He is a pleasant young man and he fell and hit his head and blacked out yesterday.  He was on the floor but he promptly recovered.  Since that time he has been having headaches, some nauseousness a little bit of dizziness, no irritability, some difficulty with falling asleep last night.  He has no known prior concussions.  He does have a history of ADD when he was younger.  He did have a little bit of trouble at work today and some difficulty reading.  He was able to drive here today.  He also injured his knee during the fall, and has primarily pain posterior medially.  He did not have any acute swelling or effusion.  He is able to walk without difficulty today in the office.  Saccades, + h and v Bess + since and tandem  See SCAT5  Past Medical History, Surgical History, Social History, Family History, Problem List, Medications, and Allergies have been reviewed and updated if relevant.  Patient Active Problem List   Diagnosis Date Noted  . Right medial knee pain 02/25/2018  . Left forearm pain 06/09/2017  . ADD (attention deficit disorder) 12/16/2014    Past Medical History:  Diagnosis Date  . Allergy    hives  . Attention deficit     Past Surgical History:  Procedure Laterality Date  . NO PAST SURGERIES      Social History    Socioeconomic History  . Marital status: Single    Spouse name: Not on file  . Number of children: Not on file  . Years of education: Not on file  . Highest education level: Not on file  Occupational History  . Not on file  Social Needs  . Financial resource strain: Not on file  . Food insecurity    Worry: Not on file    Inability: Not on file  . Transportation needs    Medical: Not on file    Non-medical: Not on file  Tobacco Use  . Smoking status: Former Smoker    Types: E-cigarettes    Quit date: 11/25/2017    Years since quitting: 1.1  . Smokeless tobacco: Never Used  Substance and Sexual Activity  . Alcohol use: Yes    Alcohol/week: 0.0 standard drinks    Comment: occ  . Drug use: Yes    Types: Cocaine    Comment: not in a while  . Sexual activity: Not on file    Comment: girlfriend. At [place]  . Physical activity    Days per week: Not on file    Minutes per session: Not on file  . Stress: Not on file  Relationships  . Social Herbalist on phone: Not on file    Gets together: Not on file  Attends religious service: Not on file    Active member of club or organization: Not on file    Attends meetings of clubs or organizations: Not on file    Relationship status: Not on file  . Intimate partner violence    Fear of current or ex partner: Not on file    Emotionally abused: Not on file    Physically abused: Not on file    Forced sexual activity: Not on file  Other Topics Concern  . Not on file  Social History Narrative   Lives with parents and 3 siblings, 2 dogs and 3 cats   Edu: [employer] studying [employer]   Activity: hunting, baseball, golf, rides 4 wheeler   Diet: good water, gatorade, fruits/vegetables daily    Family History  Problem Relation Age of Onset  . CAD Paternal Grandfather 4438       MI s/p stents x2  . Hyperlipidemia Paternal Grandfather   . Hypertension Paternal Grandfather   . Arthritis Maternal Grandfather   . Cancer Other         breast - great aunt and cousin  . Diabetes Neg Hx     No Known Allergies  Medication list reviewed and updated in full in [facility].  GEN: No fevers, chills. Nontoxic. Primarily MSK c/o today. MSK: Detailed in the HPI GI: tolerating PO intake without difficulty Neuro: No numbness, parasthesias, or tingling associated. Otherwise the pertinent positives of the ROS are noted above.   Objective:   BP 110/88   Pulse 82   Temp 98.7 F (37.1 C) (Oral)   Ht 5' 9.5" (1.765 m)   Wt 151 lb (68.5 kg)   SpO2 97%   BMI 21.98 kg/m    GEN: WDWN, NAD, Non-toxic, A & O x 3 HEENT: Atraumatic, Normocephalic. Neck supple. No masses, No LAD. Ears and Nose: No external deformity. CV: RRR, No M/G/R. No JVD. No thrill. No extra heart sounds. PULM: CTA B, no wheezes, crackles, rhonchi. No retractions. No resp. distress. No accessory muscle use. ABD: S, NT, ND, +BS. No rebound tenderness. No HSM.  EXTR: No c/c/e  Neuro: CN 2-12 grossly intact. PERRLA. EOMI. Sensation intact throughout. Str 5/5 all extremities. DTR 2+. No clonus. A and o x 4. Romberg mildly pos. Finger nose neg. Heel -shin neg.   BESS: additionally, unable to stand on 1 foot eyes open or closed Tandem, unable to stand in tandem VOMS shows dizziness and nausea Horz and Vert but without nystagmus  PSYCH: Normally interactive. Conversant. Not depressed or anxious appearing.  Calm demeanor.    Knee:  R Gait: Normal heel toe pattern ROM: 0-135 Effusion: neg Echymosis or edema: mild Patellar tendon NT Painful PLICA: neg Patellar grind: negative Medial and lateral patellar facet loading: negative medial and lateral joint lines:NT Mcmurray's neg Flexion-pinch neg Varus and valgus stress: pain with MCL stress, but does not open Lachman: neg Ant and Post drawer: neg Hip abduction, IR, ER: WNL Hip flexion str: 5/5 Hip abd: 5/5 Quad: 5/5 VMO atrophy:No Hamstring concentric and eccentric: 5/5   Radiology: No  results found.  Assessment and Plan:     ICD-10-CM   1. Concussion with < 1 hr loss of consciousness  S06.0X9A   2. Sprain of medial collateral ligament of right knee, initial encounter  S83.411A    >40 minutes spent in face to face time with patient, >50% spent in counselling or coordination of care  Significant concussion with loss of consciousness and multiple  subtypes of concussion.  Reviewed my plan of care with him for concussion management.'s please see under patient instructions.  I am in a keep him out of work for 1 week.  If he does improve and gets better he could take some light walks in about 3 to 4 days.  Reviewed what he should not do.  Right knee is stable but he does have a grade 1 MCL sprain.  I am going to place him in a patellar J brace, particularly use when he returns to work.  Patient Instructions  Mild Traumatic Brain Injury (CONCUSSSION):  Hannah BeatSpencer Jesselee Poth, MD, [facility] Adapted from [facility], [facility], and [employer]  Think of the human brain almost as an egg yolk and your skull as an egg shell. When your head or body takes a hit, it can cause your brain to shake around inside your skull, injuring the brain. A concussion is not only caused by a hit to the head, but can also be caused by an impact to the body that ends up shaking your brain in your skull, such as whiplash.  A common misconception about concussion is that one loses consciousness. However, loss of consciousness occurs in only less than 10% of concussion cases.  Concussive symptoms typically resolve in 7 to 10 days (sports-related concussions) or within 3 months (non-athletes).  Approximately 20% of people have symptoms after 6 weeks.  With concussions, we have learned that it generally takes youth longer to recover from concussions. Another thing that we now know about concussions is that it usually takes male athletes longer to recover than male athletes.   No two concussions are identical. In fact, there are at least six different clinical trajectories that concussions may take.  Signs and Symptoms of Concussion:  The signs and symptoms of a concussion are incredibly important because a concussion doesn't show up on imaging like an x-ray, CT, or MRI scan and there is no objective test, like a blood or saliva test, that can determine if a patient has a concussion. A doctor makes a concussion diagnosis based on the results of a comprehensive examination, which includes observing signs of concussion and patients reporting symptoms of concussion appearing after an impact to the head or body. Concussion signs and symptoms are the brain's way of showing it is injured and not functioning normally.  CONCUSSION SIGNS  Concussion signs are what someone could observe about you to determine if you have a concussion.   Common concussion signs include:  Loss of consciousness Problems with balance Glazed look in the eyes Amnesia Delayed response to questions Forgetting an instruction, confusion about an assignment or position, or  confusion of the game, score, or opponent Inappropriate crying Inappropriate laughter Vomiting  CONCUSSION SYMPTOMS  Concussion symptoms are what someone who is concussed will tell you that they are experiencing. Concussion symptoms typically fall into six major categories:  1- Somatic (Physical) Symptoms  Headache Light-headedness Dizziness Nausea Sensitivity to light Sensitivity to noise  2- Cognitive Symptoms  Difficulties with attention Memory problems Loss of focus Difficulty multitasking Difficulty completing mental tasks  3- Sleep Symptoms  Sleeping more than usual Sleeping less than usual Having trouble falling asleep  4- Emotional Symptoms  Anxiety Depression Panic attacks  5 - Vestibular Symptoms  The vestibular system is affected in nearly 60% of youth and adolescent athletes following  a concussion.  But what is the vestibular system?  It's the sensory system that helps with your  sense of balance and spatial orientation. Think of a gyroscope! With help from your inner ears, your vestibular system detects the motion or position of your head in space. It sends information to your brain that's needed for balance and stable vision.  Need an example? If you're moving and looking at moving objects at the same time (think riding in a car), you're able to stay focused and not lose visual clarity. During a vestibular concussion, your gyroscope isn't working at full potential.  Difficulty with balance Dizziness. It may feel like the room is spinning or a slow, wavy sensation. (Like you're on a boat!) Trouble stabilizing vision when moving your head. (We call this Vestibular-Ocular Reflux, or VOR.) Think back to riding in a car. With a vestibular concussion, you can't stay focused. (The technical name for this is Visual Motion Sensitivity, or VMS.) Triggers  These 3 things could bring on vestibular symptoms:  Dynamic movements Busy environments, like the grocery store Crowds  6 - Oculo-motor  A concussion that affects the ocular, or visual, system of the brain. Typically, patients with ocular-motor concussions report pressure headaches in the front of their head, feeling more tired than normal, and becoming more symptomatic doing math or science exercises at school.  Patients also experience difficulties with their eyes working together. Follow along to explore some of the most common symptoms.  Convergence  The eyes converge when viewing objects up close, such as with reading. With convergence problems, patients may see a double image as a target moves closer to them. Typically, without a concussion, objects can be brought very close without doubling.  Accommodation  Accommodation problems cause an object to become blurry as it is viewed up close. Accommodative and  convergence problems are often experienced together and can impact reading and other near-vision activities.  Pursuits and Saccades  The eyes use pursuit eye movements to follow objects; while saccade eyes movements allow the eyes to shift rapidly from one object to another. Tracking objects, reading a book, scrolling on a computer or even watching for a moving car while crossing the street can be difficult when patients have problems with pursuit and saccade eye movements.  Misalignment  When people with eye misalignment (one eye drifts, eyes aren't perfectly aligned) sustain a concussion, the brain may have difficulty compensating for the misalignment like it once did. This can result in blurry vision, difficulty taking notes in class and focusing on the chalkboard.  Note: This is not an exhaustive list of concussion signs and symptoms, and it may take a few days for concussion symptoms to appear after the initial injury.  TREATMENT:  THE MOST IMPORTANT THING IS REST AS SOON AS POSSIBLE AFTER INJURY SO THAT THE BRAIN CAN RECOVER. COMPLETE PHYSICAL AND MENTAL REST ARE NEEDED INITIALLY.   THAT MEANS: NO SCHOOL OR WORK FOR AT LEAST 3 DAYS AND CLEARED BY YOUR DOCTOR NO MENTAL EXERTION, MEANING NO WORK, NO HOMEWORK, NO TEST TAKING.  Avoid situations with loud noise, bright lights, or crowds. However, this doesn't mean isolate yourself in a dark room for a week. Too much isolation and boredom can be harmful, contributing to feelings of anxiety, depression, and resulting in increased recovery time. Spend time with friends and family, but monitor your symptoms and avoid situations that make you feel worse.   NO VIDEO GAMES, NO USING THE COMPUTER, NO TEXTING, NO USING SMARTPHONES, NO USE OF AN IPAD OR TABLET. DO NOT GO TO A MOVIE THEATRE OR WATCH SPORTS ON  TV. HDTV TENDS TO MAKE PEOPLE FEEL WORSE.   ON APPROXIMATELY DAY 4, BEGIN VERY LIGHT EXERCISE SUCH AS WALKING. DO NOT START ANY STRENUOUS  EXERCISE.   You will be given return to school or return to work recommendations.     Follow-up: Return in about 1 week (around 01/18/2019).  No orders of the defined types were placed in this encounter.  No orders of the defined types were placed in this encounter.   Signed,  Elpidio Galea. Jourdain Guay, MD   Outpatient Encounter Medications as of 01/11/2019  Medication Sig  . EPINEPHrine 0.3 mg/0.3 mL IJ SOAJ injection Inject 0.3 mLs (0.3 mg total) into the muscle as needed for anaphylaxis.  . [DISCONTINUED] predniSONE (DELTASONE) 50 MG tablet Take 1 tablet (50 mg total) by mouth daily with breakfast.   No facility-administered encounter medications on file as of 01/11/2019.

## 2019-01-12 ENCOUNTER — Encounter: Payer: Self-pay | Admitting: Allergy and Immunology

## 2019-01-12 ENCOUNTER — Ambulatory Visit (INDEPENDENT_AMBULATORY_CARE_PROVIDER_SITE_OTHER): Payer: No Typology Code available for payment source | Admitting: Allergy and Immunology

## 2019-01-12 VITALS — BP 116/82 | HR 89 | Temp 98.3°F | Resp 16 | Ht 71.0 in | Wt 151.0 lb

## 2019-01-12 DIAGNOSIS — T783XXA Angioneurotic edema, initial encounter: Secondary | ICD-10-CM | POA: Diagnosis not present

## 2019-01-12 DIAGNOSIS — L500 Allergic urticaria: Secondary | ICD-10-CM

## 2019-01-12 DIAGNOSIS — T782XXA Anaphylactic shock, unspecified, initial encounter: Secondary | ICD-10-CM
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
## 2019-01-12 NOTE — Patient Instructions (Addendum)
  1.  Allergen avoidance measures  2.  EpiPen or Auvi-Q 0.3, Benadryl, MD/ER evaluation for allergic reaction  3.  Blood -tryptase, alpha gal panel, TSH, T4, TP, CBC with differential, CMP, nut panel, fish panel  4.  Further evaluation treatment?  Yes if recurrent reactions

## 2019-01-13 ENCOUNTER — Encounter: Payer: Self-pay | Admitting: Allergy and Immunology

## 2019-01-15 LAB — PANEL 604726
Cor A 1 IgE: <0.1 kU/L
Cor A 14 IgE: <0.1 kU/L
Cor A 8 IgE: <0.1 kU/L
Cor A 9 IgE: 0.35 kU/L — AB

## 2019-01-15 LAB — ALLERGEN PROFILE, FOOD-FISH
Allergen Mackerel IgE: <0.1 kU/L
Allergen Salmon IgE: <0.1 kU/L
Allergen Trout IgE: <0.1 kU/L
Allergen Walley Pike IgE: <0.1 kU/L
Codfish IgE: <0.1 kU/L
Halibut IgE: <0.1 kU/L
Tuna: <0.1 kU/L

## 2019-01-15 LAB — IGE NUT PROF. W/COMPONENT RFLX
F017-IgE Hazelnut (Filbert): 0.23 kU/L — AB
F018-IgE Brazil Nut: 5.45 kU/L — AB
F020-IgE Almond: 0.28 kU/L — AB
F202-IgE Cashew Nut: 1.33 kU/L — AB
F203-IgE Pistachio Nut: 1.17 kU/L — AB
F256-IgE Walnut: <0.1 kU/L
Macadamia Nut, IgE: 0.11 kU/L — AB
Peanut, IgE: <0.1 kU/L
Pecan Nut IgE: <0.1 kU/L

## 2019-01-15 LAB — PANEL 604239: ANA O 3 IgE: 0.78 kU/L — AB

## 2019-01-15 LAB — ALLERGEN COMPONENT COMMENTS

## 2019-01-15 LAB — PANEL 604350: Ber E 1 IgE: 5.66 kU/L — AB

## 2019-01-17 NOTE — Progress Notes (Deleted)
Michael Shartzer T. Nai Dasch, Mccann [facility] [address] [place], [address] Phone: [phone]  FAX: [phone]  Michael HintChristopher R Mccann - 23 y.o. male  MRN [id number]  Date of Birth: [birth year]  Visit Date: 01/18/2019  PCP: Michael BoydenGutierrez, Javier, Mccann  Referred by: Michael BoydenGutierrez, Javier, Mccann  No chief complaint on file.  Subjective:   Michael Mccann is a 23 y.o. very pleasant male patient who presents with the following:  01/11/2019 Last OV with Michael Mccann  He is a pleasant young man and he fell and hit his head and blacked out yesterday.  He was on the floor but he promptly recovered.  Since that time he has been having headaches, some nauseousness a little bit of dizziness, no irritability, some difficulty with falling asleep last night.  He has no known prior concussions.  He does have a history of ADD when he was younger.  He did have a little bit of trouble at work today and some difficulty reading.  He was able to drive here today.   He also injured his knee during the fall, and has primarily pain posterior medially.  He did not have any acute swelling or effusion.  He is able to walk without difficulty today in the office.   Saccades, + h and v Bess + since and tandem   See SCAT5   Past Medical History, Surgical History, Social History, Family History, Problem List, Medications, and Allergies have been reviewed and updated if relevant.  Patient Active Problem List   Diagnosis Date Noted  . Right medial knee pain 02/25/2018  . Left forearm pain 06/09/2017  . ADD (attention deficit disorder) 12/16/2014    Past Medical History:  Diagnosis Date  . Allergy    hives  . Attention deficit     Past Surgical History:  Procedure Laterality Date  . NO PAST SURGERIES      Social History   Socioeconomic History  . Marital status: Single    Spouse name: Not on file  . Number of children: Not on  file  . Years of education: Not on file  . Highest education level: Not on file  Occupational History  . Not on file  Social Needs  . Financial resource strain: Not on file  . Food insecurity    Worry: Not on file    Inability: Not on file  . Transportation needs    Medical: Not on file    Non-medical: Not on file  Tobacco Use  . Smoking status: Former Smoker    Types: E-cigarettes    Quit date: 11/25/2017    Years since quitting: 1.1  . Smokeless tobacco: Never Used  Substance and Sexual Activity  . Alcohol use: Yes    Alcohol/week: 0.0 standard drinks    Comment: occ  . Drug use: Yes    Types: Cocaine    Comment: not in a while  . Sexual activity: Not on file    Comment: [facility]. [facility] [employer]  Lifestyle  . Physical activity    Days per week: Not on file    Minutes per session: Not on file  . Stress: Not on file  Relationships  . Social Musicianconnections    Talks on phone: Not on file    Gets together: Not on file    Attends religious service: Not on file    Active member of club or organization: Not on file  Attends meetings of clubs or organizations: Not on file    Relationship status: Not on file  . Intimate partner violence    Fear of current or ex partner: Not on file    Emotionally abused: Not on file    Physically abused: Not on file    Forced sexual activity: Not on file  Other Topics Concern  . Not on file  Social History Narrative   Lives with parents and 3 siblings, 2 dogs and 3 cats   Edu: [employer] studying [employer]   Activity: hunting, baseball, golf, rides 4 wheeler   Diet: good water, gatorade, fruits/vegetables daily    Family History  Problem Relation Age of Onset  . CAD Paternal Grandfather 20       MI s/p stents x2  . Hyperlipidemia Paternal Grandfather   . Hypertension Paternal Grandfather   . Arthritis Maternal Grandfather   . Cancer Other        breast - great aunt and cousin  . Diabetes Neg Hx     No Known Allergies  Medication list reviewed  and updated in full in [place].   GEN: No acute illnesses, no fevers, chills. GI: No n/v/d, eating normally Pulm: No SOB Interactive and getting along well at home.  Otherwise, ROS is as per the HPI.  Objective:   There were no vitals taken for this visit.  GEN: WDWN, NAD, Non-toxic, A & O x 3 HEENT: Atraumatic, Normocephalic. Neck supple. No masses, No LAD. Ears and Nose: No external deformity. CV: RRR, No M/G/R. No JVD. No thrill. No extra heart sounds. PULM: CTA B, no wheezes, crackles, rhonchi. No retractions. No resp. distress. No accessory muscle use. EXTR: No c/c/e NEURO Normal gait.  PSYCH: Normally interactive. Conversant. Not depressed or anxious appearing.  Calm demeanor.   Laboratory and Imaging Data:  Assessment and Plan:   ***

## 2019-01-18 ENCOUNTER — Ambulatory Visit: Payer: No Typology Code available for payment source | Admitting: Family Medicine

## 2019-01-18 LAB — COMPREHENSIVE METABOLIC PANEL
ALT: 15 IU/L (ref 0–44)
AST: 23 IU/L (ref 0–40)
Albumin/Globulin Ratio: 2.1 (ref 1.2–2.2)
Albumin: 4.8 g/dL (ref 4.1–5.2)
Alkaline Phosphatase: 96 IU/L (ref 39–117)
BUN/Creatinine Ratio: 5 — ABNORMAL LOW (ref 9–20)
BUN: 6 mg/dL (ref 6–20)
Bilirubin Total: 2.4 mg/dL — ABNORMAL HIGH (ref 0.0–1.2)
CO2: 29 mmol/L (ref 20–29)
Calcium: 9.8 mg/dL (ref 8.7–10.2)
Chloride: 103 mmol/L (ref 96–106)
Creatinine, Ser: 1.14 mg/dL (ref 0.76–1.27)
GFR calc Af Amer: 104 mL/min/{1.73_m2} (ref >59)
GFR calc non Af Amer: 90 mL/min/{1.73_m2} (ref >59)
Globulin, Total: 2.3 g/dL (ref 1.5–4.5)
Glucose: 79 mg/dL (ref 65–99)
Potassium: 4.3 mmol/L (ref 3.5–5.2)
Sodium: 142 mmol/L (ref 134–144)
Total Protein: 7.1 g/dL (ref 6.0–8.5)

## 2019-01-18 LAB — ALPHA-GAL PANEL
Alpha Gal IgE*: <0.1 kU/L (ref <0.10)
Beef (Bos spp) IgE: 0.11 kU/L (ref <0.35)
Class Interpretation: 0
Lamb/Mutton (Ovis spp) IgE: 0.1 kU/L (ref <0.35)
Pork (Sus spp) IgE: <0.1 kU/L (ref <0.35)

## 2019-01-18 LAB — T4, FREE: Free T4: 1.39 ng/dL (ref 0.82–1.77)

## 2019-01-18 LAB — TRYPTASE: Tryptase: 8.6 ug/L (ref 2.2–13.2)

## 2019-01-18 LAB — CBC WITH DIFFERENTIAL/PLATELET
Basophils Absolute: 0 10*3/uL (ref 0.0–0.2)
Basos: 0 %
EOS (ABSOLUTE): 0.1 10*3/uL (ref 0.0–0.4)
Eos: 2 %
Hematocrit: 44.9 % (ref 37.5–51.0)
Hemoglobin: 15.4 g/dL (ref 13.0–17.7)
Immature Grans (Abs): 0 10*3/uL (ref 0.0–0.1)
Immature Granulocytes: 0 %
Lymphocytes Absolute: 2 10*3/uL (ref 0.7–3.1)
Lymphs: 34 %
MCH: 32.1 pg (ref 26.6–33.0)
MCHC: 34.3 g/dL (ref 31.5–35.7)
MCV: 94 fL (ref 79–97)
Monocytes Absolute: 0.4 10*3/uL (ref 0.1–0.9)
Monocytes: 7 %
Neutrophils Absolute: 3.3 10*3/uL (ref 1.4–7.0)
Neutrophils: 57 %
Platelets: 186 10*3/uL (ref 150–450)
RBC: 4.8 x10E6/uL (ref 4.14–5.80)
RDW: 11.9 % (ref 11.6–15.4)
WBC: 5.9 10*3/uL (ref 3.4–10.8)

## 2019-01-18 LAB — THYROID PEROXIDASE ANTIBODY: Thyroperoxidase Ab SerPl-aCnc: <9 [IU]/mL (ref 0–34)

## 2019-01-18 LAB — TSH: TSH: 2.82 u[IU]/mL (ref 0.450–4.500)

## 2019-01-21 ENCOUNTER — Ambulatory Visit: Payer: No Typology Code available for payment source | Admitting: Family Medicine

## 2019-01-21 NOTE — Progress Notes (Deleted)
Michael Splinter T. Brynden Thune, Mccann [facility] [address] [place], [address] Phone: [phone]  FAX: [phone]  Michael Mccann - 23 y.o. male  MRN [id number]  Date of Birth: Jun 30, 1995  Visit Date: 01/21/2019  PCP: Michael Mccann  Referred by: Michael Mccann  No chief complaint on file.  Subjective:   Michael Mccann is a 23 y.o. very pleasant male patient who presents with the following:  He is a pleasant young man who presents after a concussion with loss of consciousness.  He did have multiple symptoms present on his last office visit.  Additionally he did have a MCL sprain.  01/11/2019 Last OV with Michael BeatSpencer Belina Mandile, Mccann  Michael Mccann is a 23 y.o. very pleasant male patient with Body mass index is 21.98 kg/m. who presents with the following:   He is a pleasant young man and he fell and hit his head and blacked out yesterday.  He was on the floor but he promptly recovered.  Since that time he has been having headaches, some nauseousness a little bit of dizziness, no irritability, some difficulty with falling asleep last night.  He has no known prior concussions.  He does have a history of ADD when he was younger.  He did have a little bit of trouble at work today and some difficulty reading.  He was able to drive here today.   He also injured his knee during the fall, and has primarily pain posterior medially.  He did not have any acute swelling or effusion.  He is able to walk without difficulty today in the office.   Saccades, + h and v Bess + since and tandem   See SCAT5  Past Medical History, Surgical History, Social History, Family History, Problem List, Medications, and Allergies have been reviewed and updated if relevant.  Patient Active Problem List   Diagnosis Date Noted  . Right medial knee pain 02/25/2018  . Left forearm pain 06/09/2017  . ADD (attention deficit  disorder) 12/16/2014    Past Medical History:  Diagnosis Date  . Allergy    hives  . Attention deficit     Past Surgical History:  Procedure Laterality Date  . NO PAST SURGERIES      Social History   Socioeconomic History  . Marital status: Single    Spouse name: Not on file  . Number of children: Not on file  . Years of education: Not on file  . Highest education level: Not on file  Occupational History  . Not on file  Social Needs  . Financial resource strain: Not on file  . Food insecurity    Worry: Not on file    Inability: Not on file  . Transportation needs    Medical: Not on file    Non-medical: Not on file  Tobacco Use  . Smoking status: Former Smoker    Types: E-cigarettes    Quit date: 11/25/2017    Years since quitting: 1.1  . Smokeless tobacco: Never Used  Substance and Sexual Activity  . Alcohol use: Yes    Alcohol/week: 0.0 standard drinks    Comment: occ  . Drug use: Yes    Types: Cocaine    Comment: not in a while  . Sexual activity: Not on file    Comment: girlfriend. At [employer]  Lifestyle  . Physical activity    Days per week: Not on file  Minutes per session: Not on file  . Stress: Not on file  Relationships  . Social Herbalist on phone: Not on file    Gets together: Not on file    Attends religious service: Not on file    Active member of club or organization: Not on file    Attends meetings of clubs or organizations: Not on file    Relationship status: Not on file  . Intimate partner violence    Fear of current or ex partner: Not on file    Emotionally abused: Not on file    Physically abused: Not on file    Forced sexual activity: Not on file  Other Topics Concern  . Not on file  Social History Narrative   Lives with parents and 3 siblings, 2 dogs and 3 cats   Edu: [employer] studying [employer]   Activity: hunting, baseball, golf, rides 4 wheeler   Diet: good water, gatorade, fruits/vegetables daily    Family History   Problem Relation Age of Onset  . CAD Paternal Grandfather 27       MI s/p stents x2  . Hyperlipidemia Paternal Grandfather   . Hypertension Paternal Grandfather   . Arthritis Maternal Grandfather   . Cancer Other        breast - great aunt and cousin  . Diabetes Neg Hx     No Known Allergies  Medication list reviewed and updated in full in [place].   GEN: No acute illnesses, no fevers, chills. GI: No n/v/d, eating normally Pulm: No SOB Interactive and getting along well at home.  Otherwise, ROS is as per the HPI.  Objective:   There were no vitals taken for this visit.  GEN: WDWN, NAD, Non-toxic, A & O x 3 HEENT: Atraumatic, Normocephalic. Neck supple. No masses, No LAD. Ears and Nose: No external deformity. CV: RRR, No M/G/R. No JVD. No thrill. No extra heart sounds. PULM: CTA B, no wheezes, crackles, rhonchi. No retractions. No resp. distress. No accessory muscle use. EXTR: No c/c/e NEURO Normal gait.  PSYCH: Normally interactive. Conversant. Not depressed or anxious appearing.  Calm demeanor.   Laboratory and Imaging Data:  Assessment and Plan:   ***

## 2019-01-25 ENCOUNTER — Encounter: Payer: Self-pay | Admitting: Family Medicine

## 2019-01-25 DIAGNOSIS — Z91018 Allergy to other foods: Secondary | ICD-10-CM | POA: Insufficient documentation

## 2019-01-25 DIAGNOSIS — Z91013 Allergy to seafood: Secondary | ICD-10-CM | POA: Insufficient documentation

## 2020-04-15 ENCOUNTER — Other Ambulatory Visit: Payer: Self-pay

## 2020-04-15 ENCOUNTER — Ambulatory Visit
Admission: EM | Admit: 2020-04-15 | Discharge: 2020-04-15 | Disposition: A | Discharge status: Home / Self Care | Payer: No Typology Code available for payment source | Attending: Family Medicine | Admitting: Family Medicine

## 2020-04-15 ENCOUNTER — Ambulatory Visit (INDEPENDENT_AMBULATORY_CARE_PROVIDER_SITE_OTHER): Payer: No Typology Code available for payment source

## 2020-04-15 DIAGNOSIS — M79672 Pain in left foot: Secondary | ICD-10-CM | POA: Diagnosis not present

## 2020-04-15 DIAGNOSIS — S99922A Unspecified injury of left foot, initial encounter: Secondary | ICD-10-CM | POA: Diagnosis not present

## 2020-04-15 MED ORDER — KETOROLAC TROMETHAMINE 10 MG PO TABS: 10.0000 mg | ORAL_TABLET | Freq: Four times a day (QID) | ORAL | 0 refills | Status: DC | PRN
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
## 2020-04-15 NOTE — Discharge Instructions (Signed)
Rest, ice, elevation.  Medication as needed.  Take care  Dr. Deondrick Searls  
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
## 2021-06-21 ENCOUNTER — Other Ambulatory Visit: Payer: Self-pay

## 2021-06-21 ENCOUNTER — Ambulatory Visit
Admission: RE | Admit: 2021-06-21 | Discharge: 2021-06-21 | Disposition: A | Discharge status: Home / Self Care | Payer: No Typology Code available for payment source | Source: Ambulatory Visit | Attending: Family Medicine | Admitting: Family Medicine

## 2021-06-21 VITALS — BP 124/78 | HR 70 | Temp 98.2°F | Resp 18

## 2021-06-21 DIAGNOSIS — S29011A Strain of muscle and tendon of front wall of thorax, initial encounter: Secondary | ICD-10-CM | POA: Diagnosis not present

## 2021-06-21 NOTE — ED Provider Notes (Signed)
[facility]    CSN: [id number] Arrival date & time: 06/21/21  1052      History   Chief Complaint Chief Complaint  Patient presents with   Chest Pain    HPI Michael Mccann is a 26 y.o. male.   Pt reports pain in his chest at lower sternum after lifting weights.    The history is provided by the patient. No language interpreter was used.  Chest Pain Pain location:  Epigastric Pain quality: aching   Pain radiates to:  Does not radiate Timing:  Constant Context: breathing   Relieved by:  Nothing  Past Medical History:  Diagnosis Date   Allergy    hives   Attention deficit     Patient Active Problem List   Diagnosis Date Noted   Tree nut allergy 01/25/2019   Fish allergy 01/25/2019   Right medial knee pain 02/25/2018   Left forearm pain 06/09/2017   ADD (attention deficit disorder) 12/16/2014    Past Surgical History:  Procedure Laterality Date   NO PAST SURGERIES         Home Medications    Prior to Admission medications   Medication Sig Start Date End Date Taking? Authorizing Provider  ketorolac (TORADOL) 10 MG tablet Take 1 tablet (10 mg total) by mouth every 6 (six) hours as needed for moderate pain or severe pain. 04/15/20   Tommie Sams, DO    Family History Family History  Problem Relation Age of Onset   CAD Paternal Grandfather 46       MI s/p stents x2   Hyperlipidemia Paternal Grandfather    Hypertension Paternal Grandfather    Arthritis Maternal Grandfather    Cancer Other        breast - great aunt and cousin   Diabetes Neg Hx     Social History Social History   Tobacco Use   Smoking status: Former    Types: E-cigarettes    Quit date: 11/25/2017    Years since quitting: 3.5   Smokeless tobacco: Never  Vaping Use   Vaping Use: Every day  Substance Use Topics   Alcohol use: Yes    Alcohol/week: 0.0 standard drinks    Comment: occ   Drug use: Yes    Types: Cocaine    Comment: not in a while      Allergies   Patient has no known allergies.   Review of Systems Review of Systems  Cardiovascular:  Positive for chest pain.  All other systems reviewed and are negative.   Physical Exam Triage Vital Signs ED Triage Vitals  Enc Vitals Group     BP 06/21/21 1111 124/78     Pulse Rate 06/21/21 1111 70     Resp 06/21/21 1111 18     Temp 06/21/21 1111 98.2 F (36.8 C)     Temp Source 06/21/21 1111 Oral     SpO2 06/21/21 1111 99 %     Weight --      Height --      Head Circumference --      Peak Flow --      Pain Score 06/21/21 1107 8     Pain Loc --      Pain Edu? --      Excl. in GC? --    No data found.  Updated Vital Signs BP 124/78 (BP Location: Left Arm)    Pulse 70    Temp 98.2 F (36.8 C) (Oral)  Resp 18    SpO2 99%   Visual Acuity Right Eye Distance:   Left Eye Distance:   Bilateral Distance:    Right Eye Near:   Left Eye Near:    Bilateral Near:     Physical Exam Vitals reviewed.  Constitutional:      Appearance: He is well-developed.  HENT:     Head: Normocephalic.  Cardiovascular:     Rate and Rhythm: Normal rate and regular rhythm.     Heart sounds: Normal heart sounds.  Pulmonary:     Effort: Pulmonary effort is normal.     Breath sounds: Normal breath sounds.  Abdominal:     General: Bowel sounds are normal.     Palpations: Abdomen is soft. There is no hepatomegaly or splenomegaly.     Tenderness: There is no abdominal tenderness.  Musculoskeletal:        General: Normal range of motion.     Comments: Tender just below xiphoid process, upper abdominal rectus muscle   Skin:    General: Skin is warm.  Neurological:     General: No focal deficit present.     Mental Status: He is alert.     UC Treatments / Results  Labs (all labs ordered are listed, but only abnormal results are displayed) Labs Reviewed - No data to display  EKG   Radiology No results found.  Procedures Procedures (including critical care  time)  Medications Ordered in UC Medications - No data to display  Initial Impression / Assessment and Plan / UC Course  I have reviewed the triage vital signs and the nursing notes.  Pertinent labs & imaging results that were available during my care of the patient were reviewed by me and considered in my medical decision making (see chart for details).      Final Clinical Impressions(s) / UC Diagnoses   Final diagnoses:  Muscle strain of chest wall, initial encounter     Discharge Instructions      Ibuprofen every 4 hours,  Rest,  recheck with your Physician if symptoms persist    ED Prescriptions   None    PDMP not reviewed this encounter.   Elson Areas, [place] 06/21/21 1126

## 2021-06-21 NOTE — Discharge Instructions (Addendum)
Ibuprofen every 4 hours,  Rest,  recheck with your Physician if symptoms persist

## 2021-06-21 NOTE — ED Triage Notes (Signed)
Pt started to have chest pain 4 days ago after leaving the gym. The pain got worse the next day after lifting.

## 2022-05-24 IMAGING — CR DG FOOT COMPLETE 3+V*L*
3 series · 3 of 3 positions shown · non-contrast
Comparison: None.

CLINICAL DATA: Run over by tractor.  Pain.

EXAM:
LEFT FOOT - COMPLETE 3+ VIEW

[foot ap]
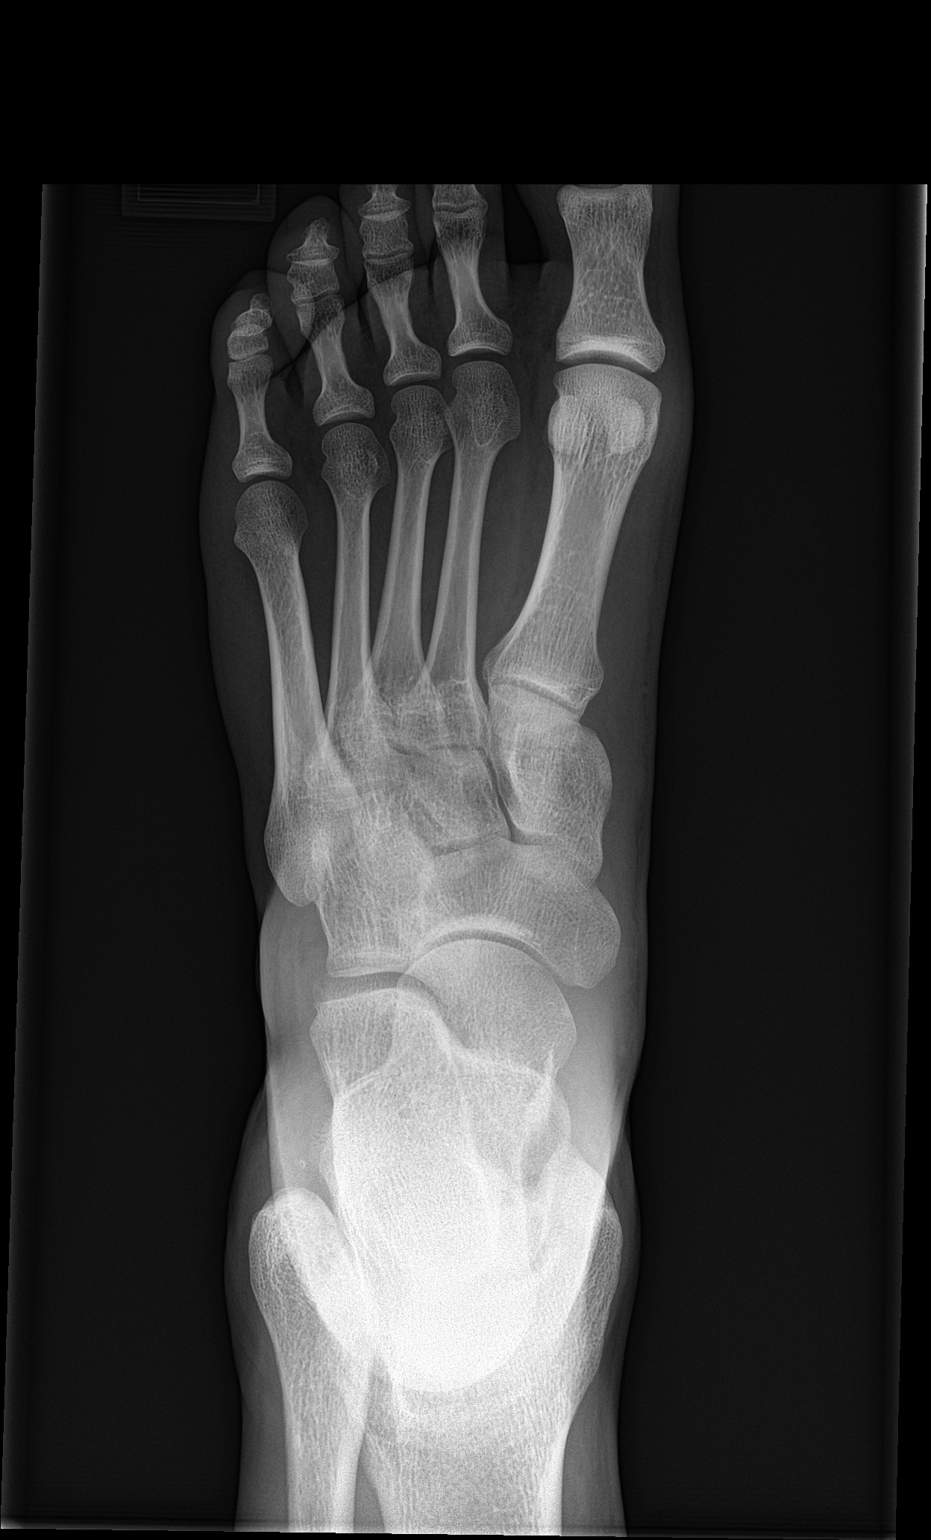

[foot obl]
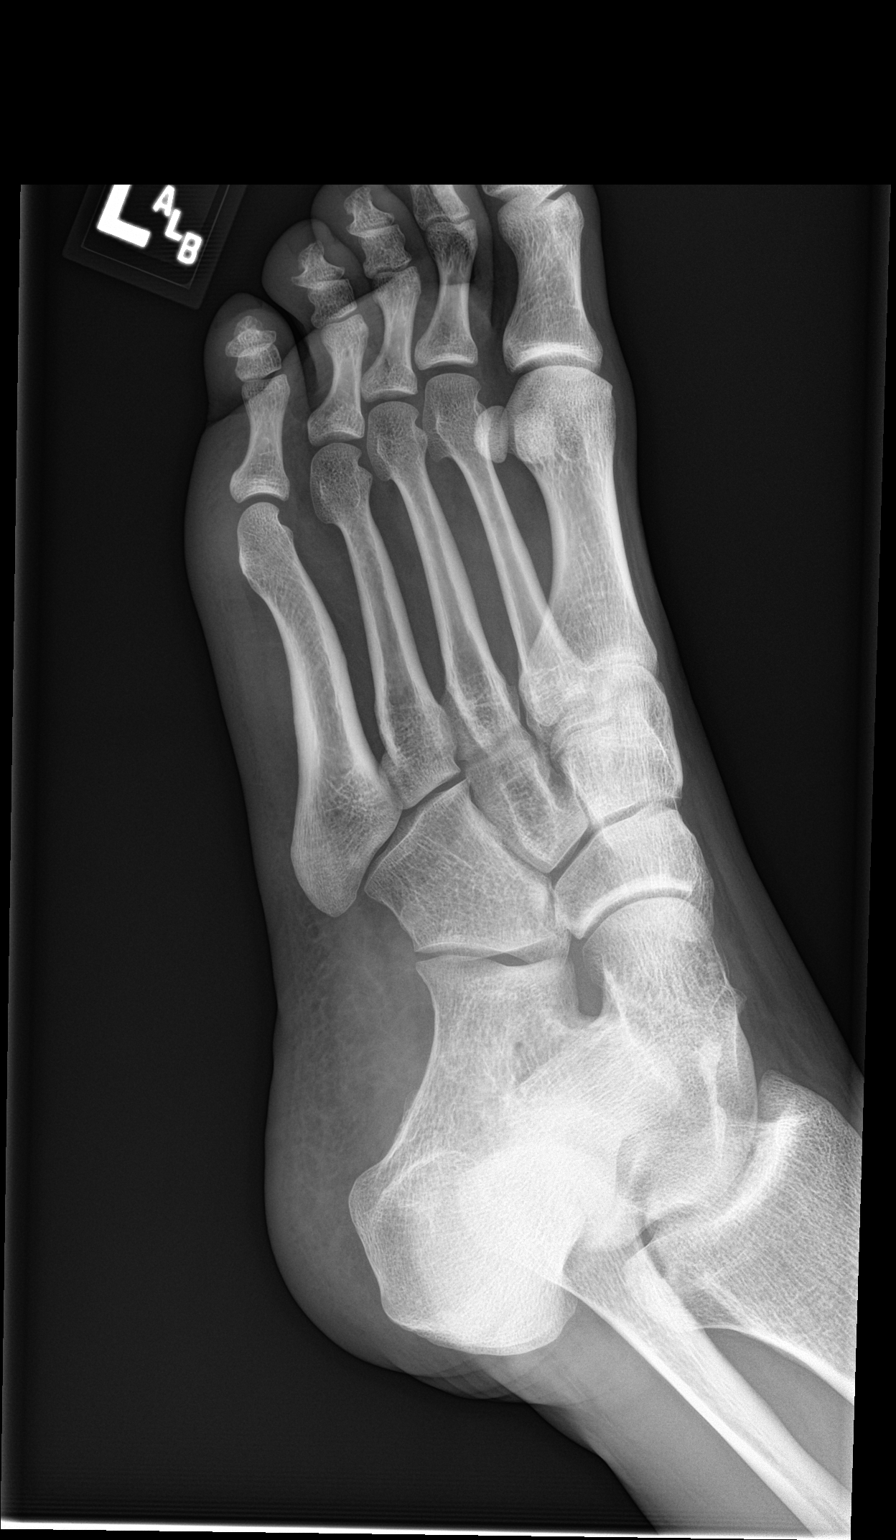

[foot lat]
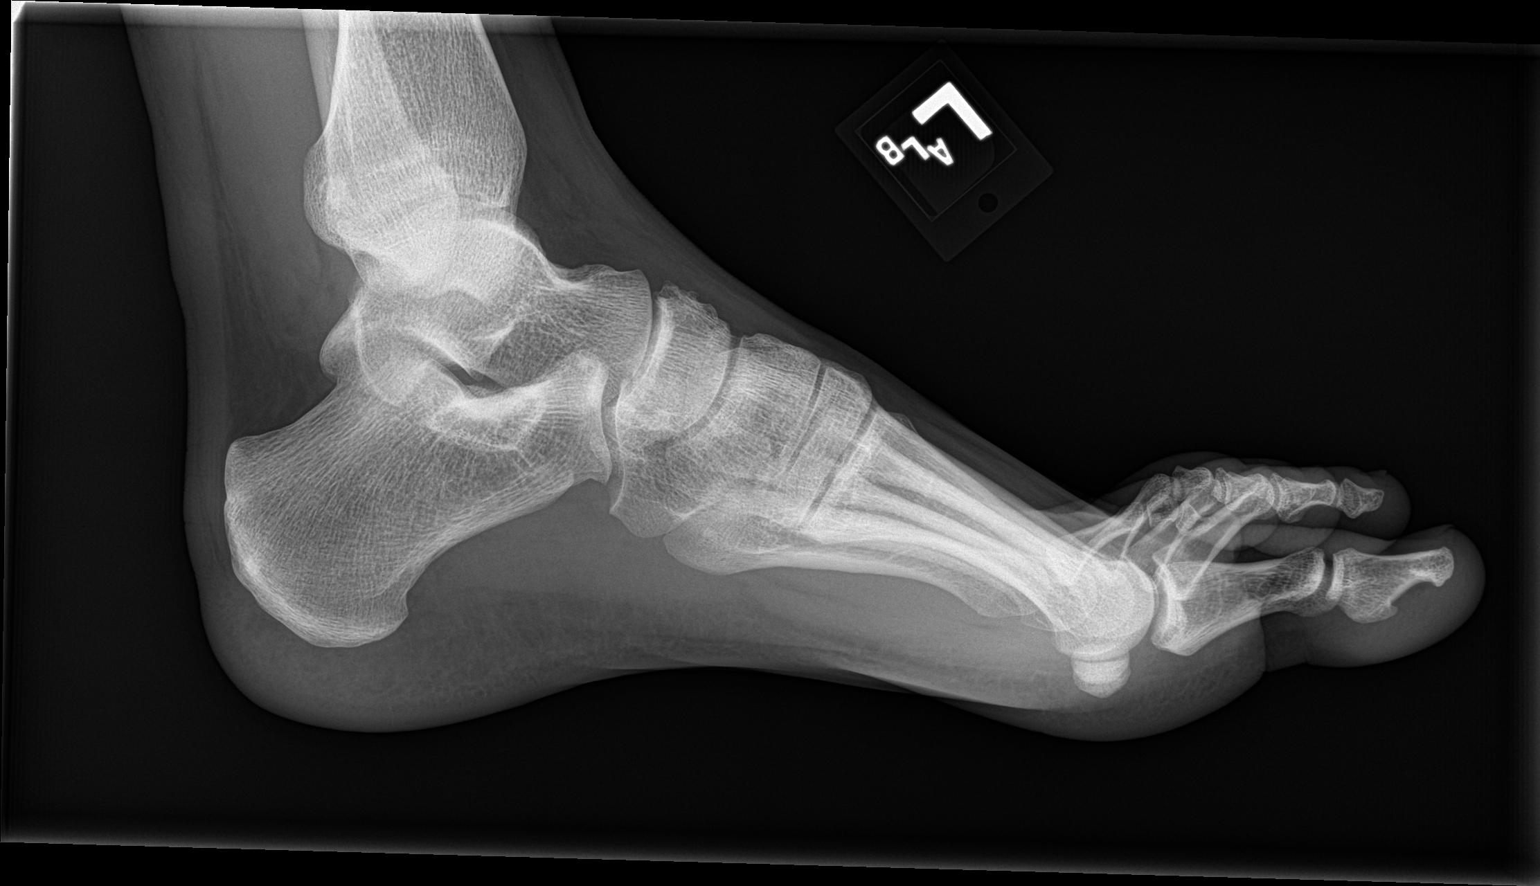

[3 of 3 positions shown; findings below may reference images not displayed]

FINDINGS: There is no evidence of fracture or dislocation. There is no
evidence of arthropathy or other focal bone abnormality. Soft
tissues are unremarkable.
IMPRESSION: Negative.

## 2022-10-09 ENCOUNTER — Ambulatory Visit
Admission: EM | Admit: 2022-10-09 | Discharge: 2022-10-09 | Disposition: A | Discharge status: Home / Self Care | Payer: No Typology Code available for payment source | Attending: Family Medicine | Admitting: Family Medicine

## 2022-10-09 DIAGNOSIS — J069 Acute upper respiratory infection, unspecified: Secondary | ICD-10-CM

## 2022-10-09 NOTE — ED Triage Notes (Signed)
Pt is here for a work- note.Pt states he was out of work with fever 3 days ago.

## 2022-10-09 NOTE — ED Provider Notes (Signed)
  [facility]   [id number] 10/09/22 Arrival Time: 1543  ASSESSMENT & PLAN:  1. Viral URI    Feeling better. Mild cough. OTC Robitussin as needed. Return to work note provided.   Follow-up Information     Eustaquio Boyden, MD.   Specialty: Family Medicine Why: As needed. Contact information: [address] [place] [address] [phone]                 Reviewed expectations re: course of current medical issues. Questions answered. Outlined signs and symptoms indicating need for more acute intervention. Understanding verbalized. After Visit Summary given.   SUBJECTIVE: History from: Patient. Michael Mccann is a 27 y.o. male.  Pt is here for a work note. Pt states he was out of work with fever 3 days ago. Feeling better now. Normal PO intake without n/v/d.  OBJECTIVE:  Vitals:   10/09/22 1608  BP: 115/75  Pulse: 72  Resp: 16  Temp: 98.5 F (36.9 C)  TempSrc: Oral  SpO2: 98%    General appearance: alert; no distress Eyes: PERRLA; EOMI; conjunctiva normal HENT: Ramos; AT; with mild nasal congestion Neck: supple  Lungs: speaks full sentences without difficulty; unlabored Psychological: alert and cooperative; normal mood and affect   No Known Allergies  Past Medical History:  Diagnosis Date   Allergy    hives   Attention deficit    Social History   Socioeconomic History   Marital status: Single    Spouse name: Not on file   Number of children: Not on file   Years of education: Not on file   Highest education level: Not on file  Occupational History   Not on file  Tobacco Use   Smoking status: Former    Types: E-cigarettes    Quit date: 11/25/2017    Years since quitting: 4.8   Smokeless tobacco: Never  Vaping Use   Vaping Use: Every day  Substance and Sexual Activity   Alcohol use: Yes    Alcohol/week: 0.0 standard drinks of alcohol    Comment: occ   Drug use: Yes    Types: Cocaine    Comment: not in a while    Sexual activity: Not on file  Other Topics Concern   Not on file  Social History Narrative   Lives with parents and 3 siblings, 2 dogs and 3 cats   Edu: [employer] studying [employer]   Activity: hunting, baseball, golf, rides 4 wheeler   Diet: good water, gatorade, fruits/vegetables daily   Social Determinants of Health   Financial Resource Strain: Not on file  Food Insecurity: Not on file  Transportation Needs: Not on file  Physical Activity: Not on file  Stress: Not on file  Social Connections: Not on file  Intimate Partner Violence: Not on file   Family History  Problem Relation Age of Onset   CAD Paternal Grandfather 29       MI s/p stents x2   Hyperlipidemia Paternal Grandfather    Hypertension Paternal Grandfather    Arthritis Maternal Grandfather    Cancer Other        breast - great aunt and cousin   Diabetes Neg Hx    Past Surgical History:  Procedure Laterality Date   NO PAST SURGERIES       Mardella Layman, MD 10/09/22 [phone]

## 2023-06-12 ENCOUNTER — Other Ambulatory Visit: Payer: Self-pay

## 2023-06-12 ENCOUNTER — Emergency Department (HOSPITAL_COMMUNITY)
Admission: EM | Admit: 2023-06-12 | Discharge: 2023-06-12 | Disposition: A | Discharge status: Home / Self Care | Payer: Self-pay | Attending: Emergency Medicine | Admitting: Emergency Medicine

## 2023-06-12 ENCOUNTER — Encounter (HOSPITAL_COMMUNITY): Payer: Self-pay | Admitting: Emergency Medicine

## 2023-06-12 ENCOUNTER — Emergency Department (HOSPITAL_COMMUNITY): Payer: Self-pay

## 2023-06-12 ENCOUNTER — Ambulatory Visit: Payer: Self-pay | Admitting: Family Medicine

## 2023-06-12 DIAGNOSIS — I10 Essential (primary) hypertension: Secondary | ICD-10-CM

## 2023-06-12 DIAGNOSIS — R0789 Other chest pain: Secondary | ICD-10-CM

## 2023-06-12 LAB — CBC
HCT: 45.8 % (ref 39.0–52.0)
Hemoglobin: 16.8 g/dL (ref 13.0–17.0)
MCH: 33.9 pg (ref 26.0–34.0)
MCHC: 36.7 g/dL — ABNORMAL HIGH (ref 30.0–36.0)
MCV: 92.3 fL (ref 80.0–100.0)
Platelets: 217 10*3/uL (ref 150–400)
RBC: 4.96 MIL/uL (ref 4.22–5.81)
RDW: 11.9 % (ref 11.5–15.5)
WBC: 5.6 10*3/uL (ref 4.0–10.5)
nRBC: 0 % (ref 0.0–0.2)

## 2023-06-12 LAB — BASIC METABOLIC PANEL
Anion gap: 9 (ref 5–15)
BUN: 12 mg/dL (ref 6–20)
CO2: 29 mmol/L (ref 22–32)
Calcium: 9.4 mg/dL (ref 8.9–10.3)
Chloride: 100 mmol/L (ref 98–111)
Creatinine, Ser: 1.34 mg/dL — ABNORMAL HIGH (ref 0.61–1.24)
GFR, Estimated: >60 mL/min (ref >60)
Glucose, Bld: 82 mg/dL (ref 70–99)
Potassium: 3.9 mmol/L (ref 3.5–5.1)
Sodium: 138 mmol/L (ref 135–145)

## 2023-06-12 LAB — TROPONIN I (HIGH SENSITIVITY)
Troponin I (High Sensitivity): <2 ng/L (ref <18)
Troponin I (High Sensitivity): 2 ng/L (ref <18)

## 2023-06-12 NOTE — ED Notes (Signed)
Pt d/c home per EDP order. Discharge summary reviewed, verbalize understanding. Ambulatory off unit. NAD. Off unit with visitor.

## 2023-06-12 NOTE — ED Provider Triage Note (Signed)
Emergency Medicine Provider Triage Evaluation Note  Michael Mccann , a 28 y.o. male  was evaluated in triage.  Pt complains of chest pain.  He states that has been intermittent for 2 years but constant for 1 month.  He states he has a grandfather who had a heart attack in his 30s.  He denies a history of hypertension or hyperlipidemia.  He does use daily energy drinks.  He complains of chest discomfort with department..  Review of Systems  Positive: Chest pain Negative: Shortness of breath  Physical Exam  BP (!) 143/95 (BP Location: Right Arm)   Pulse 92   Temp 98.7 F (37.1 C)   Resp 18   SpO2 99%  Gen:   Awake, no distress   Resp:  Normal effort  MSK:   Moves extremities without difficulty  Other:    Medical Decision Making  Medically screening exam initiated at 12:35 PM.  Appropriate orders placed.  KAYLYN TAFUR was informed that the remainder of the evaluation will be completed by another provider, this initial triage assessment does not replace that evaluation, and the importance of remaining in the ED until their evaluation is complete.     Arthor Captain, PA-C 06/12/23 1246

## 2023-06-12 NOTE — Discharge Instructions (Signed)
Workup for the chest pain without any acute findings.  Certainly no evidence of acute cardiac event.  Your blood pressure is a little bit borderline here.  Recommend following up with primary care provider or your new primary care provider and cardiology.  If the trend that we got here holds you are showing a trend towards hypertension and may need some treatment.  Return for any new or worse symptoms.  Chest x-ray today to without any acute findings.

## 2023-06-12 NOTE — Telephone Encounter (Signed)
Copied from CRM [id number]. Topic: Clinical - Red Word Triage >> Jun 12, 2023 11:41 AM Almira Coaster wrote: Red Word that prompted transfer to Nurse Triage: Patient has been experiencing Chest pain for the last four weeks, checked his blood pressure and it was 175/130 lowest 157/96, headaches and dizziness spells.   Chief Complaint: chest pain, dizziness, high BPs Symptoms: chest pain currently 5/10, current dizziness and lightheadedness, recent severely high BP readings Frequency: intermittent Pertinent Negatives: Patient denies headache Disposition: [] 911 / [x] ED /[] Urgent Care (no appt availability in office) / [] Appointment(In office/virtual)/ []  [facility]/ [] Home Care/ [] Refused Recommended Disposition /[] [facility]/ []  Follow-up with PCP Additional Notes: Pt confirms chest pain for last 4 weeks, been experiencing dizzy spells, been checking BP and gotten the following recent readings: Monday 7 pm 175/130, Tuesday 157/106, Tues night 160/110, yesterday 157/94, not taken BP this am. Pt reporting he's currently at work and does not have BP cuff with him. Pt confirms no headache at this time but 5/10 chest pain to "left center" of chest, as well as "kinda dizzy and lightheaded" right now. Advised pt head directly to ED and have another adult drive him, pt heading there shortly, he is 5 min from hospital. Pt verbalized understanding to call 911 if any worsening before gets to ED.  Reason for Disposition  Dizziness or lightheadedness  Answer Assessment - Initial Assessment Questions 1. LOCATION: "Where does it hurt?"       Left center of chest 3. ONSET: "When did the chest pain begin?" (Minutes, hours or days)      Intermittent for last 4 weeks 6. SEVERITY: "How bad is the pain?"  (e.g., Scale 1-10; mild, moderate, or severe)    - MILD (1-3): doesn't interfere with normal activities     - MODERATE (4-7): interferes with normal activities or awakens from sleep    - SEVERE  (8-10): excruciating pain, unable to do any normal activities       5/10 7. CARDIAC RISK FACTORS: "Do you have any history of heart problems or risk factors for heart disease?" (e.g., angina, prior heart attack; diabetes, high blood pressure, high cholesterol, smoker, or strong family history of heart disease)     Family hx of high BP. Pt's BP this week has been Monday 7 pm 175/130, Tuesday 157/106, Tues night 160/110, Yesterday 157/94, not taken BP this am 10. OTHER SYMPTOMS: "Do you have any other symptoms?" (e.g., dizziness, nausea, vomiting, sweating, fever, difficulty breathing, cough)       Kinda dizzy and lightheaded right now, been experiencing headaches and dizzy spells  Protocols used: Chest Pain-A-AH

## 2023-06-12 NOTE — ED Notes (Signed)
Pt up to bathroom with stable gait. 

## 2023-06-12 NOTE — ED Triage Notes (Signed)
Pt. Stated, I've had chest pain with dizziness and headaches for 4 weeks consistently, but have had on and off for 2 years.. Denies any other symptoms

## 2023-06-12 NOTE — ED Provider Notes (Addendum)
[place] EMERGENCY DEPARTMENT AT [facility] Provider Note   CSN: [id number] Arrival date & time: 06/12/23  1204     History  Chief Complaint  Patient presents with   Chest Pain   Dizziness   Headache    Michael Mccann is a 28 y.o. male.  Unfortunately had a long wait.  Patient with a complaint of chest pain dizziness associated when he has the chest pain.  Not really concerned about the headache.  Chest pain has been Constant every day for 4 weeks.  There is a family history of some hypertension.  Patient is a former smoker quit in 2019 past medical history noncontributory.  Patient is in the process of changing primary care doctors.  No nausea vomiting or diarrhea no shortness of breath.       Home Medications Prior to Admission medications   Medication Sig Start Date End Date Taking? Authorizing Provider  acetaminophen (TYLENOL) 500 MG tablet Take 1,000 mg by mouth every 6 (six) hours as needed for mild pain (pain score 1-3) or headache.   Yes [provider]  naproxen sodium (ALEVE) 220 MG tablet Take 220 mg by mouth 2 (two) times daily as needed (headache, pain).   Yes [provider]      Allergies    Almond (diagnostic), Hazelnut (filbert), Macadamia nut oil, Pecan nut (diagnostic), Tuna oil [fish oil], and Walnut    Review of Systems   Review of Systems  Constitutional:  Negative for chills and fever.  HENT:  Negative for ear pain and sore throat.   Eyes:  Negative for pain and visual disturbance.  Respiratory:  Negative for cough and shortness of breath.   Cardiovascular:  Positive for chest pain. Negative for palpitations.  Gastrointestinal:  Negative for abdominal pain and vomiting.  Genitourinary:  Negative for dysuria and hematuria.  Musculoskeletal:  Negative for arthralgias and back pain.  Skin:  Negative for color change and rash.  Neurological:  Positive for dizziness. Negative for seizures and syncope.  All other  systems reviewed and are negative.   Physical Exam Updated Vital Signs BP 130/82   Pulse 76   Temp 98.7 F (37.1 C) (Oral)   Resp 12   Ht 1.803 m (5\' 11" )   Wt 79.4 kg   SpO2 99%   BMI 24.41 kg/m  Physical Exam Vitals and nursing note reviewed.  Constitutional:      General: He is not in acute distress.    Appearance: Normal appearance. He is well-developed.  HENT:     Head: Normocephalic and atraumatic.  Eyes:     Extraocular Movements: Extraocular movements intact.     Conjunctiva/sclera: Conjunctivae normal.     Pupils: Pupils are equal, round, and reactive to light.  Cardiovascular:     Rate and Rhythm: Normal rate and regular rhythm.     Heart sounds: No murmur heard. Pulmonary:     Effort: Pulmonary effort is normal. No respiratory distress.     Breath sounds: Normal breath sounds.  Abdominal:     General: There is no distension.     Palpations: Abdomen is soft.     Tenderness: There is no abdominal tenderness.  Musculoskeletal:        General: No swelling.     Cervical back: Normal range of motion and neck supple. No rigidity.     Right lower leg: No edema.     Left lower leg: No edema.  Skin:    General:  Skin is warm and dry.     Capillary Refill: Capillary refill takes less than 2 seconds.  Neurological:     General: No focal deficit present.     Mental Status: He is alert and oriented to person, place, and time.  Psychiatric:        Mood and Affect: Mood normal.     ED Results / Procedures / Treatments   Labs (all labs ordered are listed, but only abnormal results are displayed) Labs Reviewed  BASIC METABOLIC PANEL - Abnormal; Notable for the following components:      Result Value   Creatinine, Ser 1.34 (*)    All other components within normal limits  CBC - Abnormal; Notable for the following components:   MCHC 36.7 (*)    All other components within normal limits  TROPONIN I (HIGH SENSITIVITY)  TROPONIN I (HIGH SENSITIVITY)    EKG EKG  Interpretation Date/Time:  Thursday June 12 2023 12:36:47 EST Ventricular Rate:  94 PR Interval:  134 QRS Duration:  94 QT Interval:  338 QTC Calculation: 422 R Axis:   90  Text Interpretation: Normal sinus rhythm Rightward axis Borderline ECG When compared with ECG of 03-Oct-2017 00:49, PREVIOUS ECG IS PRESENT Confirmed by Vanetta Mulders [phone]) on 06/12/2023 7:00:42 PM  Radiology DG Chest 2 View Result Date: 06/12/2023 CLINICAL DATA:  Chest pain EXAM: CHEST - 2 VIEW COMPARISON:  10/03/2017 FINDINGS: The heart size and mediastinal contours are within normal limits. Both lungs are clear. The visualized skeletal structures are unremarkable. IMPRESSION: No active cardiopulmonary disease. Electronically Signed   By: Duanne Guess D.O.   On: 06/12/2023 14:12    Procedures Procedures    Medications Ordered in ED Medications - No data to display  ED Course/ Medical Decision Making/ A&P                                 Medical Decision Making  Workup for the chest pain without any acute findings.  Chest x-ray negative.  Patient's initial troponin was less than 2 unfortunately ordered a delta troponin and I did not know that when I saw him.  Will have to wait for those results.  Chest x-ray read by cardiology no acute findings CBC white count 5.6 hemoglobin 16.8 platelets 217 basic metabolic panel normal renal function normal.  If the second troponin is not significantly abnormal will recommend follow-up with cardiology and/or his new primary care doctor.  Patient's blood pressure readings here are little borderline.  So we will need to have that followed closely as well.  Delta troponin was 2 no significant change.  Patient stable for discharge home and follow-up with cardiology regarding the blood pressure and the chest pain.   Final Clinical Impression(s) / ED Diagnoses Final diagnoses:  Atypical chest pain  Hypertension, unspecified type    Rx / DC Orders ED Discharge  Orders     None         Vanetta Mulders, MD 06/12/23 6213    Vanetta Mulders, MD 06/12/23 1918    Vanetta Mulders, MD 06/12/23 1949

## 2023-06-13 NOTE — Telephone Encounter (Signed)
Spoke with pt relaying Dr Timoteo Expose message and offered OV. Pt agrees and scheduled ER f/u on 06/23/23 at 11:30 AM.

## 2023-06-13 NOTE — Telephone Encounter (Addendum)
Reassuring evaluation in ER - BP was borderline.  Would offer OV for further evaluation with me or available provider if pt desires.

## 2023-06-23 ENCOUNTER — Ambulatory Visit (INDEPENDENT_AMBULATORY_CARE_PROVIDER_SITE_OTHER): Payer: Commercial Managed Care - PPO | Admitting: Family Medicine

## 2023-06-23 ENCOUNTER — Encounter: Payer: Self-pay | Admitting: Family Medicine

## 2023-06-23 VITALS — BP 118/82 | HR 89 | Temp 98.1°F | Ht 71.0 in | Wt 176.2 lb

## 2023-06-23 DIAGNOSIS — Z7289 Other problems related to lifestyle: Secondary | ICD-10-CM

## 2023-06-23 DIAGNOSIS — R42 Dizziness and giddiness: Secondary | ICD-10-CM

## 2023-06-23 DIAGNOSIS — R0789 Other chest pain: Secondary | ICD-10-CM

## 2023-06-23 DIAGNOSIS — F1090 Alcohol use, unspecified, uncomplicated: Secondary | ICD-10-CM

## 2023-06-23 DIAGNOSIS — R030 Elevated blood-pressure reading, without diagnosis of hypertension: Secondary | ICD-10-CM

## 2023-06-23 DIAGNOSIS — Z8249 Family history of ischemic heart disease and other diseases of the circulatory system: Secondary | ICD-10-CM

## 2023-06-23 LAB — COMPREHENSIVE METABOLIC PANEL
ALT: 18 U/L (ref 0–53)
AST: 20 U/L (ref 0–37)
Albumin: 4.7 g/dL (ref 3.5–5.2)
Alkaline Phosphatase: 91 U/L (ref 39–117)
BUN: 15 mg/dL (ref 6–23)
CO2: 31 meq/L (ref 19–32)
Calcium: 9.6 mg/dL (ref 8.4–10.5)
Chloride: 103 meq/L (ref 96–112)
Creatinine, Ser: 1.08 mg/dL (ref 0.40–1.50)
GFR: 93.83 mL/min (ref >60.00)
Glucose, Bld: 96 mg/dL (ref 70–99)
Potassium: 4.2 meq/L (ref 3.5–5.1)
Sodium: 139 meq/L (ref 135–145)
Total Bilirubin: 2.5 mg/dL — ABNORMAL HIGH (ref 0.2–1.2)
Total Protein: 7.3 g/dL (ref 6.0–8.3)

## 2023-06-23 LAB — LIPID PANEL
Cholesterol: 168 mg/dL (ref 0–200)
HDL: 49.4 mg/dL (ref >39.00)
LDL Cholesterol: 103 mg/dL — ABNORMAL HIGH (ref 0–99)
NonHDL: 118.17
Total CHOL/HDL Ratio: 3
Triglycerides: 78 mg/dL (ref 0.0–149.0)
VLDL: 15.6 mg/dL (ref 0.0–40.0)

## 2023-06-23 MED ORDER — OMEPRAZOLE 20 MG PO CPDR: 20.0000 mg | DELAYED_RELEASE_CAPSULE | Freq: Every day | ORAL | 1 refills | Status: DC

## 2023-06-23 MED ORDER — METOPROLOL TARTRATE 25 MG PO TABS: 25.0000 mg | ORAL_TABLET | Freq: Two times a day (BID) | ORAL | 0 refills | Status: DC | PRN

## 2023-06-23 NOTE — Progress Notes (Unsigned)
Ph: [phone] Fax: [phone]   Patient ID: Michael Mccann, male    DOB: 11-Jan-1996, 28 y.o.   MRN: [id number]  This visit was conducted in person.  BP 118/82 (BP Location: Right Arm, Cuff Size: Normal)   Pulse 89   Temp 98.1 F (36.7 C) (Oral)   Ht 5\' 11"  (1.803 m)   Wt 176 lb 4 oz (79.9 kg)   SpO2 97%   BMI 24.58 kg/m   BP Readings from Last 3 Encounters:  06/23/23 118/82  06/12/23 (!) 138/96  10/09/22 115/75    CC: ER f/u visit  Subjective:   HPI: Michael Mccann is a 27 y.o. male presenting on 06/23/2023 for Hospitalization Follow-up (Seen on 06/12/23 at [facility] ED, dx atypical chest pain; HTN. Pt accompanied by girlfriend, Morrie Sheldon.)   Last seen 2020 by sports medicine Dr Patsy Lager.   Recent ER visit 06/12/2023 for chest pain and dizziness. Chest pain present for 4 weeks prior, with reassuring ER evaluation including EKG, CXR, troponins x2. ER records reviewed.   BP has been elevated at home 135-170/90-130s.   He notes blood pressures trending up over the past 2 years associated with some dizziness. Then developed chest discomfort over the past month. Describes substernal tight pressure with radiation to left lower chest, can be associated with exertion and relieved with rest. No associated fevers/chills, nausea/vomiting, diaphoresis, radiation to jaw or down left arm, numbness/tingling. Denies GERD symptoms.   Chronic low back pain - manages chronically with aleve 400mg  in am and tylenol 1000mg  at night.  Plays golf - but not recently.   Fmhx CAD - grandfather had stent placed at 40yo.  Father is on cholesterol medication.  Strong fmhx HTN.  No known stroke.   Ex smoker - 1.5 ppd x 5 yrs, currently vaping VUSE 1 pod/day Alcohol - 6-10 beers/sitting Rec drugs - none      Relevant past medical, surgical, family and social history reviewed and updated as indicated. Interim medical history since our last visit reviewed. Allergies and medications reviewed and  updated. Outpatient Medications Prior to Visit  Medication Sig Dispense Refill   acetaminophen (TYLENOL) 500 MG tablet Take 1,000 mg by mouth every 6 (six) hours as needed for mild pain (pain score 1-3) or headache.     naproxen sodium (ALEVE) 220 MG tablet Take 220 mg by mouth 2 (two) times daily as needed (headache, pain).     No facility-administered medications prior to visit.     Per HPI unless specifically indicated in ROS section below Review of Systems  Objective:  BP 118/82 (BP Location: Right Arm, Cuff Size: Normal)   Pulse 89   Temp 98.1 F (36.7 C) (Oral)   Ht 5\' 11"  (1.803 m)   Wt 176 lb 4 oz (79.9 kg)   SpO2 97%   BMI 24.58 kg/m   Wt Readings from Last 3 Encounters:  06/23/23 176 lb 4 oz (79.9 kg)  06/12/23 175 lb (79.4 kg)  04/15/20 175 lb (79.4 kg)      Physical Exam Vitals and nursing note reviewed.  Constitutional:      Appearance: Normal appearance. He is not ill-appearing.  HENT:     Head: Normocephalic and atraumatic.     Mouth/Throat:     Mouth: Mucous membranes are moist.     Pharynx: Oropharynx is clear. No oropharyngeal exudate or posterior oropharyngeal erythema.  Eyes:     Extraocular Movements: Extraocular movements intact.     Pupils: Pupils are  equal, round, and reactive to light.  Neck:     Thyroid: No thyroid mass, thyromegaly or thyroid tenderness.  Cardiovascular:     Rate and Rhythm: Normal rate and regular rhythm.     Pulses: Normal pulses.     Heart sounds: Normal heart sounds. No murmur heard. Pulmonary:     Effort: Pulmonary effort is normal. No respiratory distress.     Breath sounds: Normal breath sounds. No wheezing, rhonchi or rales.  Chest:     Chest wall: No tenderness.  Abdominal:     General: Bowel sounds are normal. There is no distension.     Palpations: Abdomen is soft. There is no mass.     Tenderness: There is no abdominal tenderness. There is no guarding or rebound.     Hernia: No hernia is present.   Musculoskeletal:     Cervical back: Normal range of motion and neck supple.     Right lower leg: No edema.     Left lower leg: No edema.  Skin:    General: Skin is warm and dry.     Findings: No rash.  Neurological:     Mental Status: He is alert.  Psychiatric:        Mood and Affect: Mood normal.        Behavior: Behavior normal.       Results for orders placed or performed in visit on 06/23/23  Lipid panel   Collection Time: 06/23/23 12:32 PM  Result Value Ref Range   Cholesterol 168 0 - 200 mg/dL   Triglycerides 78.2 0.0 - 149.0 mg/dL   HDL 95.62 >13.08 mg/dL   VLDL 65.7 0.0 - 84.6 mg/dL   LDL Cholesterol 962 (H) 0 - 99 mg/dL   Total CHOL/HDL Ratio 3    NonHDL 118.17   Comprehensive metabolic panel   Collection Time: 06/23/23 12:32 PM  Result Value Ref Range   Sodium 139 135 - 145 mEq/L   Potassium 4.2 3.5 - 5.1 mEq/L   Chloride 103 96 - 112 mEq/L   CO2 31 19 - 32 mEq/L   Glucose, Bld 96 70 - 99 mg/dL   BUN 15 6 - 23 mg/dL   Creatinine, Ser 9.52 0.40 - 1.50 mg/dL   Total Bilirubin 2.5 (H) 0.2 - 1.2 mg/dL   Alkaline Phosphatase 91 39 - 117 U/L   AST 20 0 - 37 U/L   ALT 18 0 - 53 U/L   Total Protein 7.3 6.0 - 8.3 g/dL   Albumin 4.7 3.5 - 5.2 g/dL   GFR 84.13 >24.40 mL/min   Calcium 9.6 8.4 - 10.5 mg/dL   Lab Results  Component Value Date   TSH 2.820 01/12/2019    Lab Results  Component Value Date   WBC 5.6 06/12/2023   HGB 16.8 06/12/2023   HCT 45.8 06/12/2023   MCV 92.3 06/12/2023   PLT 217 06/12/2023   Assessment & Plan:   Problem List Items Addressed This Visit     Chest tightness - Primary   With recently elevated blood pressure readings however today in office BP normotensive.  Strong fmhx CAD (grandfather had stent placed at 40yo).  Not consistent with MSK pain (not reproducible), GERD, pulmonary. Some features concerning for anginal pain.  Recent ER eval was reassuring. Check FLP and Lp(a).  Will Rx omeprazole 20mg  daily x 3 wks in case  hidden GERD contributing.  Will also refer to cardiology for further evaluation given fmhx.  In interim, encouraged  quitting vaping, cutting down on alcohol and caffeine, and cutting down on NSAID use.       Relevant Orders   Comprehensive metabolic panel (Completed)   Ambulatory referral to Cardiology   Family history of premature CAD   Update FLP, lipoprotein a levels.       Relevant Orders   Lipid panel (Completed)   Lipoprotein A (LPA)   Comprehensive metabolic panel (Completed)   Ambulatory referral to Cardiology   Elevated blood pressure reading without diagnosis of hypertension   Recent elevated BP readings up to 170/130s in setting of chest tightness.  BP in office today normal (118/82). Will Rx metoprolol tartrate 25mg  BID to take PRN BP >150/100.       Current every day vaping   Encouraged cessation      Habitual alcohol use   Encouraged cutting down on alcohol intake      Other Visit Diagnoses       Dizziness            Meds ordered this encounter  Medications   omeprazole (PRILOSEC) 20 MG capsule    Sig: Take 1 capsule (20 mg total) by mouth daily. For 3 weeks then as needed    Dispense:  30 capsule    Refill:  1   metoprolol tartrate (LOPRESSOR) 25 MG tablet    Sig: Take 1 tablet (25 mg total) by mouth 2 (two) times daily as needed (for blood pressure >150/100).    Dispense:  30 tablet    Refill:  0    Orders Placed This Encounter  Procedures   Lipid panel   Lipoprotein A (LPA)   Comprehensive metabolic panel   Ambulatory referral to Cardiology    Referral Priority:   Urgent    Referral Type:   Consultation    Referral Reason:   Specialty Services Required    Number of Visits Requested:   1    Patient Instructions  Cut down on vaping, alcohol, aleve use. Labs today  Start omeprazole 20mg  daily for 2-3 weeks in case reflux contributing to symptoms. May take metoprolol 25mg  1/2-1 tablet twice daily as needed for elevated blood pressure  readings >150/100.  We will refer you to cardiologist in [facility] for further evaluation given family history.   Follow up plan: No follow-ups on file.  Eustaquio Boyden, MD

## 2023-06-23 NOTE — Patient Instructions (Addendum)
Cut down on vaping, alcohol, aleve use. Labs today  Start omeprazole 20mg  daily for 2-3 weeks in case reflux contributing to symptoms. May take metoprolol 25mg  1/2-1 tablet twice daily as needed for elevated blood pressure readings >150/100.  We will refer you to cardiologist in [facility] for further evaluation given family history.

## 2023-06-24 ENCOUNTER — Other Ambulatory Visit (INDEPENDENT_AMBULATORY_CARE_PROVIDER_SITE_OTHER): Payer: Self-pay

## 2023-06-24 ENCOUNTER — Encounter: Payer: Self-pay | Admitting: Family Medicine

## 2023-06-24 DIAGNOSIS — R0789 Other chest pain: Secondary | ICD-10-CM | POA: Insufficient documentation

## 2023-06-24 DIAGNOSIS — Z8249 Family history of ischemic heart disease and other diseases of the circulatory system: Secondary | ICD-10-CM | POA: Insufficient documentation

## 2023-06-24 DIAGNOSIS — R030 Elevated blood-pressure reading, without diagnosis of hypertension: Secondary | ICD-10-CM | POA: Insufficient documentation

## 2023-06-24 DIAGNOSIS — Z7289 Other problems related to lifestyle: Secondary | ICD-10-CM | POA: Insufficient documentation

## 2023-06-24 DIAGNOSIS — F1090 Alcohol use, unspecified, uncomplicated: Secondary | ICD-10-CM | POA: Insufficient documentation

## 2023-06-24 LAB — TSH: TSH: 2.73 u[IU]/mL (ref 0.35–5.50)

## 2023-06-24 NOTE — Assessment & Plan Note (Addendum)
With recently elevated blood pressure readings however today in office BP normotensive.  Strong fmhx CAD (grandfather had stent placed at 28yo).  Not consistent with MSK pain (not reproducible), GERD, pulmonary. Some features concerning for anginal pain.  Recent ER eval was reassuring. Check FLP and Lp(a).  Will Rx omeprazole 20mg  daily x 3 wks in case hidden GERD contributing.  Will also refer to cardiology for further evaluation given fmhx.  In interim, encouraged quitting vaping, cutting down on alcohol and caffeine, and cutting down on NSAID use.

## 2023-06-24 NOTE — Assessment & Plan Note (Addendum)
Update FLP, lipoprotein a levels.

## 2023-06-24 NOTE — Addendum Note (Signed)
Addended by: Alvina Chou on: 06/24/2023 09:36 AM   Modules accepted: Orders

## 2023-06-24 NOTE — Assessment & Plan Note (Signed)
Recent elevated BP readings up to 170/130s in setting of chest tightness.  BP in office today normal (118/82). Will Rx metoprolol tartrate 25mg  BID to take PRN BP >150/100.

## 2023-06-24 NOTE — Assessment & Plan Note (Signed)
Encouraged cutting down on alcohol intake

## 2023-06-24 NOTE — Assessment & Plan Note (Signed)
Encouraged cessation.

## 2023-06-27 ENCOUNTER — Encounter: Payer: Self-pay | Admitting: Family Medicine

## 2023-06-27 ENCOUNTER — Other Ambulatory Visit: Payer: Self-pay | Admitting: Family Medicine

## 2023-06-27 DIAGNOSIS — E7841 Elevated Lipoprotein(a): Secondary | ICD-10-CM | POA: Insufficient documentation

## 2023-06-27 LAB — LIPOPROTEIN A (LPA): Lipoprotein (a): 159 nmol/L — ABNORMAL HIGH (ref <75)

## 2023-06-27 MED ORDER — ASPIRIN 81 MG PO TBEC: 81.0000 mg | DELAYED_RELEASE_TABLET | Freq: Every day | ORAL | Status: DC

## 2023-06-27 MED ORDER — ATORVASTATIN CALCIUM 20 MG PO TABS: 20.0000 mg | ORAL_TABLET | Freq: Every day | ORAL | 6 refills | Status: DC

## 2023-06-30 NOTE — Telephone Encounter (Signed)
Message from pharmacy:  PATIENT NEEDS REFILL 90 DAY SUPPLY REQUESTED   Metoprolol  Last filled:  06/24/23, #30 Last OV:  06/23/23, hosp f/u Next OV:  none

## 2023-07-01 NOTE — Telephone Encounter (Signed)
 ERx

## 2023-07-02 ENCOUNTER — Encounter: Payer: Self-pay | Admitting: Cardiovascular Disease

## 2023-07-02 ENCOUNTER — Ambulatory Visit: Payer: Commercial Managed Care - PPO | Attending: Cardiovascular Disease | Admitting: Cardiovascular Disease

## 2023-07-02 VITALS — BP 116/78 | HR 76 | Ht 72.0 in | Wt 178.2 lb

## 2023-07-02 DIAGNOSIS — E785 Hyperlipidemia, unspecified: Secondary | ICD-10-CM | POA: Insufficient documentation

## 2023-07-02 DIAGNOSIS — R0789 Other chest pain: Secondary | ICD-10-CM

## 2023-07-02 DIAGNOSIS — E782 Mixed hyperlipidemia: Secondary | ICD-10-CM | POA: Diagnosis not present

## 2023-07-02 NOTE — Patient Instructions (Signed)
Medication Instructions:  Your physician recommends that you continue on your current medications as directed. Please refer to the Current Medication list given to you today.  *If you need a refill on your cardiac medications before your next appointment, please call your pharmacy*    Testing/Procedures: Dr. Allyson Sabal has ordered a CT coronary calcium score.   Test locations:  [facility]  [facility]  This is $99 out of pocket.   Coronary CalciumScan A coronary calcium scan is an imaging test used to look for deposits of calcium and other fatty materials (plaques) in the inner lining of the blood vessels of the heart (coronary arteries). These deposits of calcium and plaques can partly clog and narrow the coronary arteries without producing any symptoms or warning signs. This puts a person at risk for a heart attack. This test can detect these deposits before symptoms develop. Tell a health care provider about: Any allergies you have. All medicines you are taking, including vitamins, herbs, eye drops, creams, and over-the-counter medicines. Any problems you or family members have had with anesthetic medicines. Any blood disorders you have. Any surgeries you have had. Any medical conditions you have. Whether you are pregnant or may be pregnant. What are the risks? Generally, this is a safe procedure. However, problems may occur, including: Harm to a pregnant woman and her unborn baby. This test involves the use of radiation. Radiation exposure can be dangerous to a pregnant woman and her unborn baby. If you are pregnant, you generally should not have this procedure done. Slight increase in the risk of cancer. This is because of the radiation involved in the test. What happens before the procedure? No preparation is needed for this procedure. What happens during the procedure? You will undress  and remove any jewelry around your neck or chest. You will put on a hospital gown. Sticky electrodes will be placed on your chest. The electrodes will be connected to an electrocardiogram (ECG) machine to record a tracing of the electrical activity of your heart. A CT scanner will take pictures of your heart. During this time, you will be asked to lie still and hold your breath for 2-3 seconds while a picture of your heart is being taken. The procedure may vary among health care providers and hospitals. What happens after the procedure? You can get dressed. You can return to your normal activities. It is up to you to get the results of your test. Ask your health care provider, or the department that is doing the test, when your results will be ready. Summary A coronary calcium scan is an imaging test used to look for deposits of calcium and other fatty materials (plaques) in the inner lining of the blood vessels of the heart (coronary arteries). Generally, this is a safe procedure. Tell your health care provider if you are pregnant or may be pregnant. No preparation is needed for this procedure. A CT scanner will take pictures of your heart. You can return to your normal activities after the scan is done. This information is not intended to replace advice given to you by your health care provider. Make sure you discuss any questions you have with your health care provider. Document Released: 11/09/2007 Document Revised: 04/01/2016 Document Reviewed: 04/01/2016 Elsevier Interactive Patient Education  2017 [employer].    Follow-Up: At [facility], you and your health needs are our priority.  As part of our  continuing mission to provide you with exceptional heart care, we have created designated Provider Care Teams.  These Care Teams include your primary Cardiologist (physician) and Advanced Practice Providers (APPs -  Physician Assistants and Nurse Practitioners) who all work  together to provide you with the care you need, when you need it.  We recommend signing up for the patient portal called "MyChart".  Sign up information is provided on this After Visit Summary.  MyChart is used to connect with patients for Virtual Visits (Telemedicine).  Patients are able to view lab/test results, encounter notes, upcoming appointments, etc.  Non-urgent messages can be sent to your provider as well.   To learn more about what you can do with MyChart, go to [email or web address].    Your next appointment:   We will see you on an as needed basis  Provider:   Nanetta Batty, MD   Other Instructions

## 2023-07-02 NOTE — Progress Notes (Signed)
 07/02/2023 Michael Mccann   07-10-1995  [id number]  Primary Physician Rilla Baller, MD Primary Cardiologist: Dorn JINNY Lesches MD [place] [place], [place]  HPI:  Michael Mccann is a 28 y.o. thin-appearing single Caucasian male with no children he works in Conservation [employer], [employer], referred by Dr. Rilla, his PCP, for cardiovascular valuation because of atypical chest pain and risk factors.  He does have a history of treated hypertension and hyperlipidemia.  He stopped smoking 6 years ago and currently vapes.  There is no family history of heart disease.  Never had a heart attack or stroke.  He does have GERD type symptoms improved with antireflux measures.  He has an LP(a) that was performed 9 days ago that was 159.   Current Meds  Medication Sig   acetaminophen  (TYLENOL ) 500 MG tablet Take 1,000 mg by mouth every 6 (six) hours as needed for mild pain (pain score 1-3) or headache.   aspirin  EC 81 MG tablet Take 1 tablet (81 mg total) by mouth daily. Swallow whole.   atorvastatin  (LIPITOR) 20 MG tablet Take 1 tablet (20 mg total) by mouth daily.   metoprolol  tartrate (LOPRESSOR ) 25 MG tablet TAKE 1 TABLET (25 MG TOTAL) BY MOUTH 2 (TWO) TIMES DAILY AS NEEDED (FOR BLOOD PRESSURE >150/100).   omeprazole  (PRILOSEC) 20 MG capsule Take 1 capsule (20 mg total) by mouth daily. For 3 weeks then as needed     Allergies  Allergen Reactions   Almond (Diagnostic) Anaphylaxis   Hazelnut (Filbert) Anaphylaxis   Macadamia Nut Oil Anaphylaxis   Pecan Nut (Diagnostic) Anaphylaxis   Tuna Oil [Fish Oil] Anaphylaxis   Walnut Anaphylaxis    Social History   Socioeconomic History   Marital status: Single    Spouse name: Not on file   Number of children: Not on file   Years of education: Not on file   Highest education level: Not on file  Occupational History   Not on file  Tobacco Use   Smoking status: Former    Types: E-cigarettes    Quit date: 11/25/2017    Years since quitting: 5.6    Smokeless tobacco: Never  Vaping Use   Vaping status: Every Day  Substance and Sexual Activity   Alcohol use: Yes    Alcohol/week: 0.0 standard drinks of alcohol    Comment: occ   Drug use: Not Currently    Types: Cocaine    Comment: not in a while   Sexual activity: Not on file  Other Topics Concern   Not on file  Social History Narrative   Lives with parents and 3 siblings, 2 dogs and 3 cats   Edu: [employer] studying [employer]   Activity: hunting, baseball, golf, rides 4 wheeler   Diet: good water, gatorade, fruits/vegetables daily   Social Drivers of Corporate Investment Banker Strain: Not on file  Food Insecurity: Not on file  Transportation Needs: Not on file  Physical Activity: Not on file  Stress: Not on file  Social Connections: Not on file  Intimate Partner Violence: Not on file     Review of Systems: General: negative for chills, fever, night sweats or weight changes.  Cardiovascular: negative for chest pain, dyspnea on exertion, edema, orthopnea, palpitations, paroxysmal nocturnal dyspnea or shortness of breath Dermatological: negative for rash Respiratory: negative for cough or wheezing Urologic: negative for hematuria Abdominal: negative for nausea, vomiting, diarrhea, bright red blood per rectum, melena, or hematemesis Neurologic: negative for visual changes,  syncope, or dizziness All other systems reviewed and are otherwise negative except as noted above.    Blood pressure 116/78, pulse 76, height 6' (1.829 m), weight 178 lb 3.2 oz (80.8 kg), SpO2 98%.  General appearance: alert and no distress Neck: no adenopathy, no carotid bruit, no JVD, supple, symmetrical, trachea midline, and thyroid  not enlarged, symmetric, no tenderness/mass/nodules Lungs: clear to auscultation bilaterally Heart: regular rate and rhythm, S1, S2 normal, no murmur, click, rub or gallop Extremities: extremities normal, atraumatic, no cyanosis or edema Pulses: 2+ and symmetric Skin: Skin  color, texture, turgor normal. No rashes or lesions Neurologic: Grossly normal  EKG not performed today      ASSESSMENT AND PLAN:   Chest tightness History of atypical chest pain which sounds more like reflux improved with antireflux measures.  Hyperlipidemia History of hyperlipidemia recently started on atorvastatin  with lipid profile performed by his PCP 9 days ago revealing a total cholesterol 168, LDL 103 and HDL of 49.  I am going to get a coronary calcium  score to her stratify.     Dorn DOROTHA Lesches MD FACP,FACC,FAHA, [facility] 07/02/2023 2:50 PM

## 2023-07-02 NOTE — Assessment & Plan Note (Signed)
 History of atypical chest pain which sounds more like reflux improved with antireflux measures.

## 2023-07-02 NOTE — Assessment & Plan Note (Signed)
 History of hyperlipidemia recently started on atorvastatin  with lipid profile performed by his PCP 9 days ago revealing a total cholesterol 168, LDL 103 and HDL of 49.  I am going to get a coronary calcium  score to her stratify.

## 2023-07-11 ENCOUNTER — Ambulatory Visit (HOSPITAL_COMMUNITY)
Admission: RE | Admit: 2023-07-11 | Discharge: 2023-07-11 | Disposition: A | Discharge status: Home / Self Care | Payer: Commercial Managed Care - PPO | Source: Ambulatory Visit | Attending: Cardiovascular Disease | Admitting: Cardiovascular Disease

## 2023-07-11 DIAGNOSIS — R0789 Other chest pain: Secondary | ICD-10-CM | POA: Insufficient documentation

## 2023-07-11 DIAGNOSIS — E782 Mixed hyperlipidemia: Secondary | ICD-10-CM | POA: Insufficient documentation

## 2023-08-11 ENCOUNTER — Ambulatory Visit: Payer: Self-pay | Admitting: Cardiology

## 2023-08-27 ENCOUNTER — Other Ambulatory Visit: Payer: Self-pay | Admitting: Family Medicine

## 2023-09-10 ENCOUNTER — Other Ambulatory Visit: Payer: Self-pay | Admitting: Family Medicine

## 2023-09-10 NOTE — Telephone Encounter (Signed)
 Copied from CRM [id number]. Topic: Clinical - Medication Refill >> Sep 10, 2023 10:00 AM Ovid Blow wrote: Most Recent Primary Care Visit:  Provider: Claire Crick  Department: [facility]  Visit Type: HOSPITAL FOLLOW UP  Date: 06/23/2023  Medication: atorvastatin (LIPITOR) 20 MG tablet metoprolol tartrate (LOPRESSOR) 25 MG tablet omeprazole (PRILOSEC) 20 MG capsule Has the patient contacted their pharmacy? Yes (Agent: If no, request that the patient contact the pharmacy for the refill. If patient does not wish to contact the pharmacy document the reason why and proceed with request.) (Agent: If yes, when and what did the pharmacy advise?)  Is this the correct pharmacy for this prescription? Yes If no, delete pharmacy and type the correct one.  This is the patient's preferred pharmacy:  [id number] - [place], [place] - [address] AT [facility] [address] [place] [address] Phone: [phone] Fax: [phone]   Has the prescription been filled recently? No  Is the patient out of the medication? Yes  Has the patient been seen for an appointment in the last year OR does the patient have an upcoming appointment? Yes  Can we respond through MyChart? Yes  Agent: Please be advised that Rx refills may take up to 3 business days. We ask that you follow-up with your pharmacy.

## 2023-09-13 ENCOUNTER — Other Ambulatory Visit: Payer: Self-pay | Admitting: Family Medicine

## 2023-09-13 DIAGNOSIS — R030 Elevated blood-pressure reading, without diagnosis of hypertension: Secondary | ICD-10-CM

## 2023-09-17 NOTE — Telephone Encounter (Signed)
 lvm for pt to call office to schedule appt.

## 2023-09-17 NOTE — Telephone Encounter (Signed)
 Patient scheduled. He stated that the medication should be sent over to  [id number] - [place], [place] - [address] AT [facility]

## 2023-09-17 NOTE — Telephone Encounter (Signed)
lvm for pt to call office to reschedule appt

## 2023-09-17 NOTE — Telephone Encounter (Signed)
 E-scribed refill  Plz schedule CPE and fasting labs (no food/drink- except water and/or blk coffee 5 hrs prior) for additional refills.

## 2023-09-18 NOTE — Telephone Encounter (Signed)
 Spoke with pt notifying pt he can call [facility] to have rx transferred. Pt verbalizes understanding and requested [facility] be removed from chart.   Updated pt's pharmacy list.

## 2023-09-24 ENCOUNTER — Ambulatory Visit (INDEPENDENT_AMBULATORY_CARE_PROVIDER_SITE_OTHER): Admitting: Family Medicine

## 2023-09-24 ENCOUNTER — Encounter: Payer: Self-pay | Admitting: Family Medicine

## 2023-09-24 VITALS — BP 124/62 | HR 92 | Temp 98.4°F | Ht 72.0 in | Wt 168.0 lb

## 2023-09-24 DIAGNOSIS — K219 Gastro-esophageal reflux disease without esophagitis: Secondary | ICD-10-CM | POA: Diagnosis not present

## 2023-09-24 DIAGNOSIS — Z23 Encounter for immunization: Secondary | ICD-10-CM | POA: Diagnosis not present

## 2023-09-24 DIAGNOSIS — R030 Elevated blood-pressure reading, without diagnosis of hypertension: Secondary | ICD-10-CM

## 2023-09-24 DIAGNOSIS — F909 Attention-deficit hyperactivity disorder, unspecified type: Secondary | ICD-10-CM

## 2023-09-24 DIAGNOSIS — F4323 Adjustment disorder with mixed anxiety and depressed mood: Secondary | ICD-10-CM

## 2023-09-24 DIAGNOSIS — F1090 Alcohol use, unspecified, uncomplicated: Secondary | ICD-10-CM

## 2023-09-24 DIAGNOSIS — E7841 Elevated Lipoprotein(a): Secondary | ICD-10-CM

## 2023-09-24 DIAGNOSIS — E782 Mixed hyperlipidemia: Secondary | ICD-10-CM

## 2023-09-24 DIAGNOSIS — R0789 Other chest pain: Secondary | ICD-10-CM

## 2023-09-24 MED ORDER — METOPROLOL SUCCINATE ER 25 MG PO TB24: 25.0000 mg | ORAL_TABLET | Freq: Every day | ORAL | 3 refills | Status: DC

## 2023-09-24 MED ORDER — ESCITALOPRAM OXALATE 5 MG PO TABS: 5.0000 mg | ORAL_TABLET | Freq: Every day | ORAL | 6 refills | Status: DC

## 2023-09-24 MED ORDER — OMEPRAZOLE 40 MG PO CPDR: 40.0000 mg | DELAYED_RELEASE_CAPSULE | Freq: Every day | ORAL | 6 refills | Status: AC

## 2023-09-24 NOTE — Progress Notes (Signed)
 Ph: [phone] Fax: [phone]   Patient ID: Arzella Laurence, male    DOB: September 24, 1995, 28 y.o.   MRN: [id number]  This visit was conducted in person.  BP 124/62   Pulse 92   Temp 98.4 F (36.9 C) (Oral)   Ht 6' (1.829 m)   Wt 168 lb (76.2 kg)   SpO2 97%   BMI 22.78 kg/m    CC: discuss anxiety  Subjective:   HPI: OLSEN KUBICK is a 28 y.o. male presenting on 09/24/2023 for Anxiety   Chest pain/tightness - saw cardiology 06/2023 with reassuring evaluation - thought more related to GERD that improved with antireflux measures.  Lp(a) was elevated to 159 but FLP was overall reassuring and coronary calcium  score was reassuringly zero (07/12/2023).  Lipitor 20mg  and aspirin  subsequently stopped.   Ran out of metoprolol  and omeprazole  - felt better while on this.  Notes decreased appetite since stopping omeprazole .  Notes sprite can cause chest pain.  Off PPI for the past 3 weeks.  Denies significant abd pain, indigestion, nausea/vomiting, diarrhea/constipation. No gassiness, bloating ,belching.  No dysphagia, early satiety, blood in stool.   Trouble with over-thinking, excess worrying. Ongoing for at least the past 6 months, but feels he's had some trouble with anxiety for years. Trouble sleeping due to this. Difficulty concentrating. Frequently can feel overwhelmed which leads to "breaking down" crying/emotional. No known acute stressor to trigger symptoms.   H/o ADHD in grade school - was on strattera, vyvanse from 6th-11th grades. Good grades in school. Thinks vyvanse was more effective.     Relevant past medical, surgical, family and social history reviewed and updated as indicated. Interim medical history since our last visit reviewed. Allergies and medications reviewed and updated. Outpatient Medications Prior to Visit  Medication Sig Dispense Refill   acetaminophen  (TYLENOL ) 500 MG tablet Take 1,000 mg by mouth every 6 (six) hours as needed for mild pain  (pain score 1-3) or headache.     aspirin  EC 81 MG tablet Take 1 tablet (81 mg total) by mouth daily. Swallow whole.     atorvastatin  (LIPITOR) 20 MG tablet Take 1 tablet (20 mg total) by mouth daily. 30 tablet 6   metoprolol  tartrate (LOPRESSOR ) 25 MG tablet TAKE 1 TABLET (25 MG TOTAL) BY MOUTH 2 (TWO) TIMES DAILY AS NEEDED (FOR BLOOD PRESSURE >150/100). 90 tablet 0   omeprazole  (PRILOSEC) 20 MG capsule TAKE 1 CAPSULE (20 MG TOTAL) BY MOUTH DAILY. FOR 3 WEEKS THEN AS NEEDED 30 capsule 1   naproxen sodium (ALEVE) 220 MG tablet Take 220 mg by mouth 2 (two) times daily as needed (headache, pain). (Patient not taking: Reported on 07/02/2023)     No facility-administered medications prior to visit.     Per HPI unless specifically indicated in ROS section below Review of Systems  Objective:  BP 124/62   Pulse 92   Temp 98.4 F (36.9 C) (Oral)   Ht 6' (1.829 m)   Wt 168 lb (76.2 kg)   SpO2 97%   BMI 22.78 kg/m   Wt Readings from Last 3 Encounters:  09/24/23 168 lb (76.2 kg)  07/02/23 178 lb 3.2 oz (80.8 kg)  06/23/23 176 lb 4 oz (79.9 kg)      Physical Exam Vitals and nursing note reviewed.  Constitutional:      Appearance: Normal appearance. He is not ill-appearing.  HENT:     Mouth/Throat:     Mouth: Mucous membranes are moist.  Pharynx: Oropharynx is clear. No oropharyngeal exudate or posterior oropharyngeal erythema.  Eyes:     Extraocular Movements: Extraocular movements intact.     Pupils: Pupils are equal, round, and reactive to light.  Neck:     Thyroid : No thyroid  mass or thyromegaly.  Cardiovascular:     Rate and Rhythm: Normal rate and regular rhythm.     Pulses: Normal pulses.     Heart sounds: Normal heart sounds. No murmur heard. Pulmonary:     Effort: Pulmonary effort is normal. No respiratory distress.     Breath sounds: Normal breath sounds. No wheezing, rhonchi or rales.  Abdominal:     General: Bowel sounds are normal. There is no distension.      Palpations: Abdomen is soft. There is no mass.     Tenderness: There is no abdominal tenderness. There is no guarding or rebound.     Hernia: No hernia is present.  Musculoskeletal:     Right lower leg: No edema.     Left lower leg: No edema.  Neurological:     Mental Status: He is alert.  Psychiatric:        Mood and Affect: Mood normal.        Behavior: Behavior normal.       Results for orders placed or performed in visit on 06/24/23  TSH   Collection Time: 06/24/23 10:38 AM  Result Value Ref Range   TSH 2.73 0.35 - 5.50 uIU/mL   Lab Results  Component Value Date   CHOL 168 06/23/2023   HDL 49.40 06/23/2023   LDLCALC 103 (H) 06/23/2023   TRIG 78.0 06/23/2023   CHOLHDL 3 06/23/2023   Lab Results  Component Value Date   NA 139 06/23/2023   CL 103 06/23/2023   K 4.2 06/23/2023   CO2 31 06/23/2023   BUN 15 06/23/2023   CREATININE 1.08 06/23/2023   GFR 93.83 06/23/2023   CALCIUM  9.6 06/23/2023   ALBUMIN 4.7 06/23/2023   GLUCOSE 96 06/23/2023    Lab Results  Component Value Date   ALT 18 06/23/2023   AST 20 06/23/2023   ALKPHOS 91 06/23/2023   BILITOT 2.5 (H) 06/23/2023    Lab Results  Component Value Date   WBC 5.6 06/12/2023   HGB 16.8 06/12/2023   HCT 45.8 06/12/2023   MCV 92.3 06/12/2023   PLT 217 06/12/2023      09/24/2023    2:08 PM  Depression screen PHQ 2/9  Decreased Interest 0  Down, Depressed, Hopeless 1  PHQ - 2 Score 1  Altered sleeping 3  Tired, decreased energy 1  Change in appetite 1  Feeling bad or failure about yourself  3  Trouble concentrating 0  Moving slowly or fidgety/restless 0  Suicidal thoughts 0  PHQ-9 Score 9  Difficult doing work/chores Somewhat difficult       09/24/2023    2:10 PM  GAD 7 : Generalized Anxiety Score  Nervous, Anxious, on Edge 1  Control/stop worrying 1  Worry too much - different things 1  Trouble relaxing 1  Restless 3  Easily annoyed or irritable 1  Afraid - awful might happen 1  Total GAD  7 Score 9  Anxiety Difficulty Somewhat difficult   Assessment & Plan:   Problem List Items Addressed This Visit     ADD (attention deficit disorder) (Chronic)   H/o this. Consider retrial stimulant pending effect of above, consider wellbutrin.       Chest tightness  Non-cardiac  Calcium  score of zero 06/2023  Will stop statin, aspirin .  Continue omeprazole , increase to 40mg  daily - see below.       Elevated blood pressure reading without diagnosis of hypertension   BP overall stable off medication.  Heart rate remains elevated.  Will restart Toprol  XL 25mg  daily.      Habitual alcohol use   Has significantly cut down on alcohol use.       Elevated lipoprotein A level   Will stay off statin given recent reassuring cardiac CT with coronary calcium  score       Hyperlipidemia   Stay off statin given normal calcium  score      Relevant Medications   metoprolol  succinate (TOPROL -XL) 25 MG 24 hr tablet   Adjustment disorder with mixed anxiety and depressed mood - Primary   Unclear trigger. Affecting daily activities.  Discussed antidepressant and/or counseling.  Reviewed side effects to watch for including possible increased suicidality and sexual dysfunction.  Start lexapro  5mg  daily, reassess in 4-6 wks at previously scheduled physical.  Will need to discuss healthy stress relieving measures at f/u visit.       Gastroesophageal reflux disease   Anticipate this contributes to recent chest discomfort.  He did better on omeprazole  20mg  daily x 3-4 wks, however symptoms are recurring since he stopped omperazole.  Off PPI for 3 wks - will check H pylori Ag stool test.  Then will restart omeprazole  40mg  daily 30 min prior to large meal daily for 1 month, reassess control at f/u visit.       Relevant Medications   omeprazole  (PRILOSEC) 40 MG capsule   Other Relevant Orders   H. pylori antigen, stool   Other Visit Diagnoses       Need for Tdap vaccination        Relevant Orders   Tdap vaccine greater than or equal to 7yo IM (Completed)        Meds ordered this encounter  Medications   metoprolol  succinate (TOPROL -XL) 25 MG 24 hr tablet    Sig: Take 1 tablet (25 mg total) by mouth daily.    Dispense:  90 tablet    Refill:  3   omeprazole  (PRILOSEC) 40 MG capsule    Sig: Take 1 capsule (40 mg total) by mouth daily.    Dispense:  30 capsule    Refill:  6   escitalopram  (LEXAPRO ) 5 MG tablet    Sig: Take 1 tablet (5 mg total) by mouth daily.    Dispense:  30 tablet    Refill:  6    Orders Placed This Encounter  Procedures   Tdap vaccine greater than or equal to 7yo IM   H. pylori antigen, stool    Standing Status:   Future    Expiration Date:   09/23/2024    Patient Instructions  Tdap today  Stay off aspirin  and atorvastatin  Start toprol  XL 25mg  daily.  Pass by lab to pick up stool test for H pylori infection.  Start omeprazole  40mg  daily - take 30 min before large meal.  Avoid anti inflammatories like aleve, advil, ibuprofen.  Limit alcohol, limit dark sodas.  No energy drinks.  Start lexapro  5mg  daily in the morning - sent to pharmacy.  Keep physical appoint in June  Follow up plan: No follow-ups on file.  Claire Crick, MD

## 2023-09-24 NOTE — Patient Instructions (Addendum)
 Tdap today  Stay off aspirin  and atorvastatin  Start toprol  XL 25mg  daily.  Pass by lab to pick up stool test for H pylori infection.  Start omeprazole  40mg  daily - take 30 min before large meal.  Avoid anti inflammatories like aleve, advil, ibuprofen.  Limit alcohol, limit dark sodas.  No energy drinks.  Start lexapro 5mg  daily in the morning - sent to pharmacy.  Keep physical appoint in June

## 2023-09-25 ENCOUNTER — Encounter: Payer: Self-pay | Admitting: Family Medicine

## 2023-09-25 DIAGNOSIS — K219 Gastro-esophageal reflux disease without esophagitis: Secondary | ICD-10-CM | POA: Insufficient documentation

## 2023-09-25 DIAGNOSIS — F4323 Adjustment disorder with mixed anxiety and depressed mood: Secondary | ICD-10-CM | POA: Insufficient documentation

## 2023-09-25 NOTE — Assessment & Plan Note (Signed)
 Will stay off statin given recent reassuring cardiac CT with coronary calcium  score

## 2023-09-25 NOTE — Assessment & Plan Note (Signed)
 Has significantly cut down on alcohol use.

## 2023-09-25 NOTE — Assessment & Plan Note (Signed)
 H/o this. Consider retrial stimulant pending effect of above, consider wellbutrin.

## 2023-09-25 NOTE — Assessment & Plan Note (Signed)
 Stay off statin given normal calcium  score

## 2023-09-25 NOTE — Assessment & Plan Note (Addendum)
 BP overall stable off medication.  Heart rate remains elevated.  Will restart Toprol  XL 25mg  daily.

## 2023-09-25 NOTE — Assessment & Plan Note (Signed)
 Anticipate this contributes to recent chest discomfort.  He did better on omeprazole  20mg  daily x 3-4 wks, however symptoms are recurring since he stopped omperazole.  Off PPI for 3 wks - will check H pylori Ag stool test.  Then will restart omeprazole  40mg  daily 30 min prior to large meal daily for 1 month, reassess control at f/u visit.

## 2023-09-25 NOTE — Assessment & Plan Note (Signed)
 Non-cardiac  Calcium  score of zero 06/2023  Will stop statin, aspirin .  Continue omeprazole , increase to 40mg  daily - see below.

## 2023-09-25 NOTE — Assessment & Plan Note (Addendum)
 Unclear trigger. Affecting daily activities.  Discussed antidepressant and/or counseling.  Reviewed side effects to watch for including possible increased suicidality and sexual dysfunction.  Start lexapro  5mg  daily, reassess in 4-6 wks at previously scheduled physical.  Will need to discuss healthy stress relieving measures at f/u visit.

## 2023-11-07 ENCOUNTER — Encounter: Payer: Self-pay | Admitting: Family Medicine

## 2023-11-07 ENCOUNTER — Ambulatory Visit (INDEPENDENT_AMBULATORY_CARE_PROVIDER_SITE_OTHER): Admitting: Family Medicine

## 2023-11-07 VITALS — BP 118/78 | HR 89 | Temp 98.1°F | Ht 70.0 in | Wt 167.1 lb

## 2023-11-07 DIAGNOSIS — F4323 Adjustment disorder with mixed anxiety and depressed mood: Secondary | ICD-10-CM

## 2023-11-07 DIAGNOSIS — L989 Disorder of the skin and subcutaneous tissue, unspecified: Secondary | ICD-10-CM | POA: Insufficient documentation

## 2023-11-07 DIAGNOSIS — F1090 Alcohol use, unspecified, uncomplicated: Secondary | ICD-10-CM

## 2023-11-07 DIAGNOSIS — Z0000 Encounter for general adult medical examination without abnormal findings: Secondary | ICD-10-CM | POA: Insufficient documentation

## 2023-11-07 DIAGNOSIS — E7841 Elevated Lipoprotein(a): Secondary | ICD-10-CM | POA: Diagnosis not present

## 2023-11-07 DIAGNOSIS — R030 Elevated blood-pressure reading, without diagnosis of hypertension: Secondary | ICD-10-CM

## 2023-11-07 DIAGNOSIS — Z8249 Family history of ischemic heart disease and other diseases of the circulatory system: Secondary | ICD-10-CM | POA: Diagnosis not present

## 2023-11-07 DIAGNOSIS — K219 Gastro-esophageal reflux disease without esophagitis: Secondary | ICD-10-CM

## 2023-11-07 MED ORDER — ESCITALOPRAM OXALATE 5 MG PO TABS: 5.0000 mg | ORAL_TABLET | Freq: Every day | ORAL | 3 refills | Status: DC

## 2023-11-07 MED ORDER — METOPROLOL SUCCINATE ER 25 MG PO TB24: 25.0000 mg | ORAL_TABLET | Freq: Every day | ORAL | 3 refills | Status: DC

## 2023-11-07 NOTE — Assessment & Plan Note (Signed)
 Paternal fmhx CAD - he notes father age 58s had recent calcium  score of 50s, grandfather with stent age 67s.  Personal Lp(a) elevated however calcium  score of zero - will remain off statin at this time, consider rpt calcium  score in 3-5 yrs. Recommend yearly FLP.

## 2023-11-07 NOTE — Progress Notes (Signed)
 Ph: [phone] Fax: [phone]   Patient ID: Michael Mccann, male    DOB: Mar 25, 1996, 28 y.o.   MRN: [id number]  This visit was conducted in person.  BP 118/78   Pulse 89   Temp 98.1 F (36.7 C) (Oral)   Ht 5' 10 (1.778 m)   Wt 167 lb 2 oz (75.8 kg)   SpO2 97%   BMI 23.98 kg/m    CC: CPE Subjective:   HPI: Michael Mccann is a 28 y.o. male presenting on 11/07/2023 for Annual Exam   Lp(a) was elevated to 159 but FLP was overall reassuring and coronary calcium  score was reassuringly zero (07/12/2023). Saw cardiology - lipitor, aspirin  stopped.   Notes benefit with omeprazole  and metoprolol .  Returned H pylori stool Ag test today however he's been on PP - will ask to cancel due to chance of false negative.  Healthy diet changes. Has cut down on alcohol as well.   Adjustment disorder with mixed mood - last visit lexapro  5mg  daily started - notes this has helped, partner also notes improvement in anxiety. New mattress is helping sleep.   Preventative: no fmhx colon or prostate cancer Lung cancer screening - not eligible  Flu shot - has not had COVID shot - none Tdap 08/2023 Advanced directive discussion - not discussed Seat belt use discussed Sunscreen use discussed. No changing moles on skin. Sleep - averaging 6 hours /night Dentist - sees q1-2 yrs Eye exam - yearly - wears contacts and glasses Non smoker No smokeless tobacco Alcohol - only on weekends - a couple beers  Lives with parents and 3 siblings, 2 dogs and 3 cats [employer]: [employer] studying [employer] Activity: hunting, baseball, golf, rides 4 wheeler, gym 2-3 x/wk Diet: good water, gatorade, fruits/vegetables daily     Relevant past medical, surgical, family and social history reviewed and updated as indicated. Interim medical history since our last visit reviewed. Allergies and medications reviewed and updated. Outpatient Medications Prior to Visit  Medication Sig Dispense Refill   acetaminophen   (TYLENOL ) 500 MG tablet Take 1,000 mg by mouth every 6 (six) hours as needed for mild pain (pain score 1-3) or headache.     omeprazole  (PRILOSEC) 40 MG capsule Take 1 capsule (40 mg total) by mouth daily. 30 capsule 6   escitalopram  (LEXAPRO ) 5 MG tablet Take 1 tablet (5 mg total) by mouth daily. 30 tablet 6   metoprolol  succinate (TOPROL -XL) 25 MG 24 hr tablet Take 1 tablet (25 mg total) by mouth daily. 90 tablet 3   No facility-administered medications prior to visit.     Per HPI unless specifically indicated in ROS section below Review of Systems  Constitutional:  Negative for activity change, appetite change, chills, fatigue, fever and unexpected weight change.  HENT:  Negative for hearing loss.   Eyes:  Negative for visual disturbance.  Respiratory:  Negative for cough, chest tightness, shortness of breath and wheezing.   Cardiovascular:  Negative for chest pain, palpitations and leg swelling.  Gastrointestinal:  Negative for abdominal distention, abdominal pain, blood in stool, constipation, diarrhea, nausea and vomiting.  Genitourinary:  Negative for difficulty urinating and hematuria.  Musculoskeletal:  Negative for arthralgias, myalgias and neck pain.  Skin:  Negative for rash.  Neurological:  Negative for dizziness, seizures, syncope and headaches.  Hematological:  Negative for adenopathy. Does not bruise/bleed easily.  Psychiatric/Behavioral:  Negative for dysphoric mood. The patient is not nervous/anxious.     Objective:  BP 118/78  Pulse 89   Temp 98.1 F (36.7 C) (Oral)   Ht 5' 10 (1.778 m)   Wt 167 lb 2 oz (75.8 kg)   SpO2 97%   BMI 23.98 kg/m   Wt Readings from Last 3 Encounters:  11/07/23 167 lb 2 oz (75.8 kg)  09/24/23 168 lb (76.2 kg)  07/02/23 178 lb 3.2 oz (80.8 kg)      Physical Exam Vitals and nursing note reviewed.  Constitutional:      General: He is not in acute distress.    Appearance: Normal appearance. He is well-developed. He is not  ill-appearing.  HENT:     Head: Normocephalic and atraumatic.     Right Ear: Hearing, tympanic membrane, ear canal and external ear normal.     Left Ear: Hearing, tympanic membrane, ear canal and external ear normal.     Mouth/Throat:     Mouth: Mucous membranes are moist.     Pharynx: Oropharynx is clear. No oropharyngeal exudate or posterior oropharyngeal erythema.   Eyes:     General: No scleral icterus.    Extraocular Movements: Extraocular movements intact.     Conjunctiva/sclera: Conjunctivae normal.     Pupils: Pupils are equal, round, and reactive to light.   Neck:     Thyroid : No thyroid  mass or thyromegaly.   Cardiovascular:     Rate and Rhythm: Normal rate and regular rhythm.     Pulses: Normal pulses.          Radial pulses are 2+ on the right side and 2+ on the left side.     Heart sounds: Normal heart sounds. No murmur heard. Pulmonary:     Effort: Pulmonary effort is normal. No respiratory distress.     Breath sounds: Normal breath sounds. No wheezing, rhonchi or rales.  Abdominal:     General: Bowel sounds are normal. There is no distension.     Palpations: Abdomen is soft. There is no mass.     Tenderness: There is no abdominal tenderness. There is no guarding or rebound.     Hernia: No hernia is present.   Musculoskeletal:        General: Normal range of motion.     Cervical back: Normal range of motion and neck supple.     Right lower leg: No edema.     Left lower leg: No edema.  Lymphadenopathy:     Cervical: No cervical adenopathy.   Skin:    General: Skin is warm and dry.     Findings: Lesion present. No rash.         Comments: Raised subcm lesion to R upper chest with mild surrounding erythema    Neurological:     General: No focal deficit present.     Mental Status: He is alert and oriented to person, place, and time.   Psychiatric:        Mood and Affect: Mood normal.        Behavior: Behavior normal.        Thought Content: Thought  content normal.        Judgment: Judgment normal.       Results for orders placed or performed in visit on 06/24/23  TSH   Collection Time: 06/24/23 10:38 AM  Result Value Ref Range   TSH 2.73 0.35 - 5.50 uIU/mL   Lab Results  Component Value Date   CHOL 168 06/23/2023   HDL 49.40 06/23/2023   LDLCALC 103 (H) 06/23/2023  TRIG 78.0 06/23/2023   CHOLHDL 3 06/23/2023    Lab Results  Component Value Date   ALT 18 06/23/2023   AST 20 06/23/2023   ALKPHOS 91 06/23/2023   BILITOT 2.5 (H) 06/23/2023    Lab Results  Component Value Date   NA 139 06/23/2023   CL 103 06/23/2023   K 4.2 06/23/2023   CO2 31 06/23/2023   BUN 15 06/23/2023   CREATININE 1.08 06/23/2023   GFR 93.83 06/23/2023   CALCIUM  9.6 06/23/2023   ALBUMIN 4.7 06/23/2023   GLUCOSE 96 06/23/2023       11/07/2023    2:56 PM 09/24/2023    2:08 PM  Depression screen PHQ 2/9  Decreased Interest 0 0  Down, Depressed, Hopeless 0 1  PHQ - 2 Score 0 1  Altered sleeping 1 3  Tired, decreased energy 0 1  Change in appetite 0 1  Feeling bad or failure about yourself  0 3  Trouble concentrating 1 0  Moving slowly or fidgety/restless 0 0  Suicidal thoughts 0 0  PHQ-9 Score 2 9  Difficult doing work/chores Not difficult at all Somewhat difficult       11/07/2023    2:56 PM 09/24/2023    2:10 PM  GAD 7 : Generalized Anxiety Score  Nervous, Anxious, on Edge 0 1  Control/stop worrying 0 1  Worry too much - different things 0 1  Trouble relaxing 1 1  Restless 1 3  Easily annoyed or irritable 0 1  Afraid - awful might happen 0 1  Total GAD 7 Score 2 9  Anxiety Difficulty Not difficult at all Somewhat difficult   Assessment & Plan:   Problem List Items Addressed This Visit     Health maintenance examination - Primary (Chronic)   Preventative protocols reviewed and updated unless pt declined. Discussed healthy diet and lifestyle.       Family history of premature CAD   Paternal fmhx CAD - he notes father  age 68s had recent calcium  score of 62s, grandfather with stent age 27s.  Personal Lp(a) elevated however calcium  score of zero - will remain off statin at this time, consider rpt calcium  score in 3-5 yrs. Recommend yearly FLP.       Elevated blood pressure reading without diagnosis of hypertension   Chronic, stable on low dose Toprol  XL 25mg  daily - continue      Habitual alcohol use   Continues to limit alcohol use.       Hyperlipidemia   Paternal fmhx CAD - he notes father age 30s had recent calcium  score of 42s, grandfather with stent age 34s.  Personal Lp(a) elevated however calcium  score of zero - will remain off statin at this time, consider rpt calcium  score in 3-5 yrs. Recommend yearly FLP.  Latest LDL 103, latest nonHDL 118. Recommend LDL goal <100, nonHDL goal <130.  Anticipate will need statin later in life - discussed this. The ASCVD Risk score (Arnett DK, et al., 2019) failed to calculate for the following reasons:   The 2019 ASCVD risk score is only valid for ages 56 to 65       Relevant Medications   metoprolol  succinate (TOPROL -XL) 25 MG 24 hr tablet   Adjustment disorder with mixed anxiety and depressed mood   Chronic, significant improvement with lexapro  5mg  daily - continue this.       Gastroesophageal reflux disease   Doing better on daily omeprazole  40mg  daily over the past 6 weeks.  Discussed trying tapering dose to every other day.  Discussed further taper off PPI as tolerated.       Skin lesion   Of unclear etiology. Worse after recent trip to beach.  Suggested try OTC cortizone-10. If no better or any enlargement, will recommend dermatology evaluation.         Meds ordered this encounter  Medications   metoprolol  succinate (TOPROL -XL) 25 MG 24 hr tablet    Sig: Take 1 tablet (25 mg total) by mouth daily.    Dispense:  90 tablet    Refill:  3   escitalopram  (LEXAPRO ) 5 MG tablet    Sig: Take 1 tablet (5 mg total) by mouth daily.    Dispense:   90 tablet    Refill:  3    No orders of the defined types were placed in this encounter.   Patient Instructions  Let me know if skin spot is changing or persists for dermatology referral.  Try slow taper off omeprazole  as tolerated You are doing well today  Return as needed or in 1 year for next physical   Follow up plan: Return in about 1 year (around 11/06/2024) for annual exam, prior fasting for blood work.  Claire Crick, MD

## 2023-11-07 NOTE — Assessment & Plan Note (Signed)
 Doing better on daily omeprazole  40mg  daily over the past 6 weeks.  Discussed trying tapering dose to every other day.  Discussed further taper off PPI as tolerated.

## 2023-11-07 NOTE — Assessment & Plan Note (Signed)
 Of unclear etiology. Worse after recent trip to beach.  Suggested try OTC cortizone-10. If no better or any enlargement, will recommend dermatology evaluation.

## 2023-11-07 NOTE — Assessment & Plan Note (Signed)
 Paternal fmhx CAD - he notes father age 105s had recent calcium  score of 40s, grandfather with stent age 42s.  Personal Lp(a) elevated however calcium  score of zero - will remain off statin at this time, consider rpt calcium  score in 3-5 yrs. Recommend yearly FLP.  Latest LDL 103, latest nonHDL 118. Recommend LDL goal <100, nonHDL goal <130.  Anticipate will need statin later in life - discussed this. The ASCVD Risk score (Arnett DK, et al., 2019) failed to calculate for the following reasons:   The 2019 ASCVD risk score is only valid for ages 74 to 45

## 2023-11-07 NOTE — Assessment & Plan Note (Addendum)
 Continues to limit alcohol use.

## 2023-11-07 NOTE — Assessment & Plan Note (Signed)
 Chronic, significant improvement with lexapro  5mg  daily - continue this.

## 2023-11-07 NOTE — Assessment & Plan Note (Signed)
 Preventative protocols reviewed and updated unless pt declined. Discussed healthy diet and lifestyle.

## 2023-11-07 NOTE — Assessment & Plan Note (Signed)
 Chronic, stable on low dose Toprol  XL 25mg  daily - continue

## 2023-11-07 NOTE — Patient Instructions (Addendum)
 Let me know if skin spot is changing or persists for dermatology referral.  Try slow taper off omeprazole  as tolerated You are doing well today  Return as needed or in 1 year for next physical

## 2023-11-16 ENCOUNTER — Encounter: Payer: Self-pay | Admitting: Family Medicine

## 2023-11-18 ENCOUNTER — Telehealth: Payer: Self-pay

## 2023-11-18 MED ORDER — BUPROPION HCL ER (SR) 100 MG PO TB12: 100.0000 mg | ORAL_TABLET | Freq: Every day | ORAL | 3 refills | Status: AC

## 2023-11-18 NOTE — Telephone Encounter (Signed)
 Spoke with patient  Discussed alternatives.  Drop Toprol  XL to 1/2 tablet daily or  Try wellbutrin SR 100mg  in am in place of lexapro .   He would like to try both. He will update me with effect

## 2023-11-18 NOTE — Addendum Note (Signed)
 Addended by: RILLA BALLER on: 11/18/2023 05:12 PM   Modules accepted: Orders

## 2023-11-18 NOTE — Telephone Encounter (Signed)
 Copied from CRM [id number]. Topic: Clinical - Medical Advice >> Nov 18, 2023 12:50 PM Donna BRAVO wrote: Reason for CRM: patient calling, patient sent message through MyChart no 06/22/25and has not received an answer, patient would like to speak with Dr Rilla about this issue  Patient has not taken this new medication since Friday 11/14/23  Patient phone [phone]
